# Patient Record
Sex: Male | Born: 1970 | Race: White | Hispanic: No | Marital: Married | Smoking: Never smoker
Health system: Southern US, Community
[De-identification: ages and names within clinical notes are randomized; demographics above are authoritative.]

## PROBLEM LIST (undated history)

## (undated) DIAGNOSIS — E119 Type 2 diabetes mellitus without complications: Secondary | ICD-10-CM

## (undated) DIAGNOSIS — R0789 Other chest pain: Secondary | ICD-10-CM

## (undated) HISTORY — DX: Type 2 diabetes mellitus without complications: E11.9

## (undated) HISTORY — DX: Other chest pain: R07.89

---

## 2010-11-15 ENCOUNTER — Encounter: Payer: Self-pay | Admitting: Internal Medicine

## 2010-11-21 ENCOUNTER — Ambulatory Visit
Admission: RE | Admit: 2010-11-21 | Discharge: 2010-11-21 | Payer: Self-pay | Source: Home / Self Care | Attending: Internal Medicine | Admitting: Internal Medicine

## 2010-11-21 ENCOUNTER — Ambulatory Visit: Payer: Self-pay | Admitting: Internal Medicine

## 2010-11-21 DIAGNOSIS — R0989 Other specified symptoms and signs involving the circulatory and respiratory systems: Secondary | ICD-10-CM

## 2010-11-21 DIAGNOSIS — R079 Chest pain, unspecified: Secondary | ICD-10-CM | POA: Insufficient documentation

## 2010-11-21 DIAGNOSIS — R0609 Other forms of dyspnea: Secondary | ICD-10-CM | POA: Insufficient documentation

## 2010-11-25 ENCOUNTER — Telehealth: Payer: Self-pay | Admitting: Internal Medicine

## 2010-11-27 ENCOUNTER — Ambulatory Visit: Admit: 2010-11-27 | Payer: Self-pay | Admitting: Internal Medicine

## 2010-11-27 ENCOUNTER — Inpatient Hospital Stay (HOSPITAL_COMMUNITY)
Admission: EM | Admit: 2010-11-27 | Discharge: 2010-12-02 | Disposition: A | Discharge status: Home / Self Care | Payer: Self-pay | Source: Home / Self Care | Attending: Internal Medicine | Admitting: Internal Medicine

## 2010-11-27 LAB — CBC
HCT: 44.4 % (ref 39.0–52.0)
Hemoglobin: 15.4 g/dL (ref 13.0–17.0)
MCH: 29.8 pg (ref 26.0–34.0)
MCHC: 34.7 g/dL (ref 30.0–36.0)
MCV: 86 fL (ref 78.0–100.0)
Platelets: 279 10*3/uL (ref 150–400)
RBC: 5.16 MIL/uL (ref 4.22–5.81)
RDW: 12 % (ref 11.5–15.5)
WBC: 15 10*3/uL — ABNORMAL HIGH (ref 4.0–10.5)

## 2010-11-27 LAB — BASIC METABOLIC PANEL
BUN: 13 mg/dL (ref 6–23)
CO2: 24 mEq/L (ref 19–32)
Calcium: 9.1 mg/dL (ref 8.4–10.5)
Chloride: 102 mEq/L (ref 96–112)
Creatinine, Ser: 0.95 mg/dL (ref 0.4–1.5)
GFR calc Af Amer: >60 mL/min (ref >60)
GFR calc non Af Amer: >60 mL/min (ref >60)
Glucose, Bld: 161 mg/dL — ABNORMAL HIGH (ref 70–99)
Potassium: 3.9 mEq/L (ref 3.5–5.1)
Sodium: 136 mEq/L (ref 135–145)

## 2010-11-27 LAB — D-DIMER, QUANTITATIVE (NOT AT ARMC): D-Dimer, Quant: 2.13 ug/mL-FEU — ABNORMAL HIGH (ref 0.00–0.48)

## 2010-11-28 LAB — CBC
HCT: 40.6 % (ref 39.0–52.0)
Hemoglobin: 14 g/dL (ref 13.0–17.0)
MCH: 29.9 pg (ref 26.0–34.0)
MCHC: 34.5 g/dL (ref 30.0–36.0)
MCV: 86.6 fL (ref 78.0–100.0)
Platelets: 242 10*3/uL (ref 150–400)
RBC: 4.69 MIL/uL (ref 4.22–5.81)
RDW: 12.2 % (ref 11.5–15.5)
WBC: 16.2 10*3/uL — ABNORMAL HIGH (ref 4.0–10.5)

## 2010-11-28 LAB — BASIC METABOLIC PANEL
BUN: 9 mg/dL (ref 6–23)
CO2: 26 mEq/L (ref 19–32)
Calcium: 8.8 mg/dL (ref 8.4–10.5)
Chloride: 100 mEq/L (ref 96–112)
Creatinine, Ser: 1.04 mg/dL (ref 0.4–1.5)
GFR calc Af Amer: >60 mL/min (ref >60)
GFR calc non Af Amer: >60 mL/min (ref >60)
Glucose, Bld: 151 mg/dL — ABNORMAL HIGH (ref 70–99)
Potassium: 4.6 mEq/L (ref 3.5–5.1)
Sodium: 134 mEq/L — ABNORMAL LOW (ref 135–145)

## 2010-11-28 LAB — HEPATIC FUNCTION PANEL
ALT: 42 U/L (ref 0–53)
AST: 31 U/L (ref 0–37)
Albumin: 3.2 g/dL — ABNORMAL LOW (ref 3.5–5.2)
Alkaline Phosphatase: 80 U/L (ref 39–117)
Bilirubin, Direct: 0.3 mg/dL (ref 0.0–0.3)
Indirect Bilirubin: 1.2 mg/dL — ABNORMAL HIGH (ref 0.3–0.9)
Total Bilirubin: 1.5 mg/dL — ABNORMAL HIGH (ref 0.3–1.2)
Total Protein: 7.5 g/dL (ref 6.0–8.3)

## 2010-11-28 LAB — STREP PNEUMONIAE URINARY ANTIGEN: Strep Pneumo Urinary Antigen: NEGATIVE

## 2010-11-28 LAB — PROCALCITONIN: Procalcitonin: 0.22 ng/mL

## 2010-11-28 LAB — TSH: TSH: 1.134 u[IU]/mL (ref 0.350–4.500)

## 2010-11-29 LAB — INFLUENZA PANEL BY PCR (TYPE A & B)
H1N1 flu by pcr: NOT DETECTED
Influenza A By PCR: NEGATIVE
Influenza B By PCR: NEGATIVE

## 2010-11-29 LAB — CBC
HCT: 39.6 % (ref 39.0–52.0)
Hemoglobin: 13.6 g/dL (ref 13.0–17.0)
MCH: 30 pg (ref 26.0–34.0)
MCHC: 34.3 g/dL (ref 30.0–36.0)
MCV: 87.2 fL (ref 78.0–100.0)
Platelets: 235 10*3/uL (ref 150–400)
RBC: 4.54 MIL/uL (ref 4.22–5.81)
RDW: 11.9 % (ref 11.5–15.5)
WBC: 14.6 10*3/uL — ABNORMAL HIGH (ref 4.0–10.5)

## 2010-11-29 LAB — COMPREHENSIVE METABOLIC PANEL
ALT: 52 U/L (ref 0–53)
AST: 34 U/L (ref 0–37)
Albumin: 3 g/dL — ABNORMAL LOW (ref 3.5–5.2)
Alkaline Phosphatase: 81 U/L (ref 39–117)
BUN: 7 mg/dL (ref 6–23)
CO2: 26 mEq/L (ref 19–32)
Calcium: 8.6 mg/dL (ref 8.4–10.5)
Chloride: 102 mEq/L (ref 96–112)
Creatinine, Ser: 0.89 mg/dL (ref 0.4–1.5)
GFR calc Af Amer: >60 mL/min (ref >60)
GFR calc non Af Amer: >60 mL/min (ref >60)
Glucose, Bld: 133 mg/dL — ABNORMAL HIGH (ref 70–99)
Potassium: 4.3 mEq/L (ref 3.5–5.1)
Sodium: 137 mEq/L (ref 135–145)
Total Bilirubin: 1.1 mg/dL (ref 0.3–1.2)
Total Protein: 7.5 g/dL (ref 6.0–8.3)

## 2010-11-29 LAB — HIV ANTIBODY (ROUTINE TESTING W REFLEX): HIV: NONREACTIVE

## 2010-11-29 LAB — DIFFERENTIAL
Basophils Absolute: 0 10*3/uL (ref 0.0–0.1)
Basophils Relative: 0 % (ref 0–1)
Eosinophils Absolute: 0.1 10*3/uL (ref 0.0–0.7)
Eosinophils Relative: 0 % (ref 0–5)
Lymphocytes Relative: 13 % (ref 12–46)
Lymphs Abs: 1.9 10*3/uL (ref 0.7–4.0)
Monocytes Absolute: 1.5 10*3/uL — ABNORMAL HIGH (ref 0.1–1.0)
Monocytes Relative: 10 % (ref 3–12)
Neutro Abs: 11.1 10*3/uL — ABNORMAL HIGH (ref 1.7–7.7)
Neutrophils Relative %: 76 % (ref 43–77)

## 2010-11-29 LAB — GLUCOSE, CAPILLARY
Glucose-Capillary: 172 mg/dL — ABNORMAL HIGH (ref 70–99)
Glucose-Capillary: 117 mg/dL — ABNORMAL HIGH (ref 70–99)

## 2010-11-29 LAB — HEMOGLOBIN A1C
Hgb A1c MFr Bld: 6.6 % — ABNORMAL HIGH (ref <5.7)
Mean Plasma Glucose: 143 mg/dL — ABNORMAL HIGH (ref <117)

## 2010-11-30 LAB — GLUCOSE, CAPILLARY
Glucose-Capillary: 128 mg/dL — ABNORMAL HIGH (ref 70–99)
Glucose-Capillary: 112 mg/dL — ABNORMAL HIGH (ref 70–99)
Glucose-Capillary: 99 mg/dL (ref 70–99)
Glucose-Capillary: 132 mg/dL — ABNORMAL HIGH (ref 70–99)
Glucose-Capillary: 128 mg/dL — ABNORMAL HIGH (ref 70–99)

## 2010-11-30 LAB — DIFFERENTIAL
Basophils Absolute: 0 10*3/uL (ref 0.0–0.1)
Basophils Relative: 0 % (ref 0–1)
Eosinophils Absolute: 0.1 10*3/uL (ref 0.0–0.7)
Eosinophils Relative: 1 % (ref 0–5)
Lymphocytes Relative: 12 % (ref 12–46)
Lymphs Abs: 1.4 10*3/uL (ref 0.7–4.0)
Monocytes Absolute: 0.9 10*3/uL (ref 0.1–1.0)
Monocytes Relative: 8 % (ref 3–12)
Neutro Abs: 9.5 10*3/uL — ABNORMAL HIGH (ref 1.7–7.7)
Neutrophils Relative %: 80 % — ABNORMAL HIGH (ref 43–77)

## 2010-11-30 LAB — LEGIONELLA ANTIGEN, URINE: Legionella Antigen, Urine: NEGATIVE

## 2010-11-30 LAB — CBC
HCT: 38.2 % — ABNORMAL LOW (ref 39.0–52.0)
Hemoglobin: 13 g/dL (ref 13.0–17.0)
MCH: 29.2 pg (ref 26.0–34.0)
MCHC: 34 g/dL (ref 30.0–36.0)
MCV: 85.8 fL (ref 78.0–100.0)
Platelets: 273 10*3/uL (ref 150–400)
RBC: 4.45 MIL/uL (ref 4.22–5.81)
RDW: 12.1 % (ref 11.5–15.5)
WBC: 11.9 10*3/uL — ABNORMAL HIGH (ref 4.0–10.5)

## 2010-12-01 LAB — DIFFERENTIAL
Basophils Absolute: 0 10*3/uL (ref 0.0–0.1)
Basophils Relative: 0 % (ref 0–1)
Eosinophils Absolute: 0.1 10*3/uL (ref 0.0–0.7)
Eosinophils Relative: 1 % (ref 0–5)
Lymphocytes Relative: 10 % — ABNORMAL LOW (ref 12–46)
Lymphs Abs: 1.2 10*3/uL (ref 0.7–4.0)
Monocytes Absolute: 1 10*3/uL (ref 0.1–1.0)
Monocytes Relative: 8 % (ref 3–12)
Neutro Abs: 10.1 10*3/uL — ABNORMAL HIGH (ref 1.7–7.7)
Neutrophils Relative %: 81 % — ABNORMAL HIGH (ref 43–77)

## 2010-12-01 LAB — BASIC METABOLIC PANEL
BUN: 7 mg/dL (ref 6–23)
CO2: 28 mEq/L (ref 19–32)
Calcium: 9 mg/dL (ref 8.4–10.5)
Chloride: 102 mEq/L (ref 96–112)
Creatinine, Ser: 0.88 mg/dL (ref 0.4–1.5)
GFR calc Af Amer: >60 mL/min (ref >60)
GFR calc non Af Amer: >60 mL/min (ref >60)
Glucose, Bld: 144 mg/dL — ABNORMAL HIGH (ref 70–99)
Potassium: 4.2 mEq/L (ref 3.5–5.1)
Sodium: 138 mEq/L (ref 135–145)

## 2010-12-01 LAB — CBC
HCT: 38.4 % — ABNORMAL LOW (ref 39.0–52.0)
Hemoglobin: 13 g/dL (ref 13.0–17.0)
MCH: 29.2 pg (ref 26.0–34.0)
MCHC: 33.9 g/dL (ref 30.0–36.0)
MCV: 86.3 fL (ref 78.0–100.0)
Platelets: 303 10*3/uL (ref 150–400)
RBC: 4.45 MIL/uL (ref 4.22–5.81)
RDW: 12.1 % (ref 11.5–15.5)
WBC: 12.5 10*3/uL — ABNORMAL HIGH (ref 4.0–10.5)

## 2010-12-01 LAB — CULTURE, RESPIRATORY W GRAM STAIN

## 2010-12-01 LAB — GLUCOSE, CAPILLARY
Glucose-Capillary: 117 mg/dL — ABNORMAL HIGH (ref 70–99)
Glucose-Capillary: 181 mg/dL — ABNORMAL HIGH (ref 70–99)
Glucose-Capillary: 115 mg/dL — ABNORMAL HIGH (ref 70–99)
Glucose-Capillary: 134 mg/dL — ABNORMAL HIGH (ref 70–99)

## 2010-12-01 LAB — CULTURE, RESPIRATORY

## 2010-12-02 LAB — GLUCOSE, CAPILLARY
Glucose-Capillary: 121 mg/dL — ABNORMAL HIGH (ref 70–99)
Glucose-Capillary: 104 mg/dL — ABNORMAL HIGH (ref 70–99)
Glucose-Capillary: 110 mg/dL — ABNORMAL HIGH (ref 70–99)

## 2010-12-04 LAB — CULTURE, BLOOD (ROUTINE X 2)
Culture  Setup Time: 201201270548
Culture  Setup Time: 201201270548
Culture: NO GROWTH
Culture: NO GROWTH

## 2010-12-04 NOTE — Assessment & Plan Note (Signed)
Summary: Pulmonary consultation/ unexplained doe and R CP   Visit Type:  Initial Consult Copy to:  [facility] Primary Provider/Referring Provider:  none  CC:  CP.  History of Present Illness: 40 yom  hispanic / PR with tendency to sinus problems but never breathing problems previously and no problem with competition sports through HS and even in 20's.  When started back up with ex mid 30s didn't feel normal aerobic capacity.   November 21, 2010  1st pulmonary office eval cc sob assoc with R CP >  rec otc tylenol better but back nl nl no cough, typically while standing never while lying down,  equal front / back lasts several min to up to 45 min, always better lying down, somewhat migratory and improved with moving shoulder into different positions also.   no assoc cough. onset was 7 days ago, abupt, not progressive, variable.    Pt denies any significant sore throat, dysphagia, itching, sneezing,  nasal congestion or excess secretions,  fever, chills, sweats, unintended wt loss, pleuritic or exertional cp, hempoptysis, change in activity tolerance  orthopnea pnd or leg swelling.  no unusual travel. no nausea or change with meals/ greasy foods.  Current Medications (verified): 1)  Tylenol Extra Strength 500 Mg Tabs (Acetaminophen) .... 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  The patient denies shortness of breath with activity, shortness of breath at rest, productive cough, non-productive cough, coughing up blood, irregular heartbeats, acid heartburn, indigestion, loss of appetite, weight change, abdominal pain, difficulty swallowing, sore throat,  tooth/dental problems, headaches, nasal congestion/difficulty breathing through nose, sneezing, itching, ear ache, anxiety, depression, hand/feet swelling, rash, change in color of mucus, and fever.    Vital Signs:  Patient profile:   40 year old male Height:      69 inches Weight:      208.25 pounds BMI:     30.86 O2 Sat:      96 % on Room air Temp:     98.1 degrees F oral Pulse rate:   86 / minute BP sitting:   120 / 86  (left arm) Cuff size:   large  Vitals Entered By: Vernie Murders (November 21, 2010 11:52 AM)  O2 Flow:  Room air  Serial Vital Signs/Assessments:  Comments: Ambulatory Pulse Oximetry  Resting; HR__80___    02 Sat_96%RA____  Lap1 (185 feet)   HR__98___   02 Sat_94%RA____ Lap2 (185 feet)   HR__97___   02 Sat_94%RA____    Lap3 (185 feet)   HR__94___   02 Sat_96%RA___  _x__Test Completed without Difficulty Zackery Barefoot CMA  November 21, 2010 12:27 PM  ___Test Stopped due to:   By: Zackery Barefoot CMA    Physical Exam  Additional Exam:  anxious amb hispanic male nad HEENT: nl dentition, turbinates, and orophanx. Nl external ear canals without cough reflex NECK :  without JVD/Nodes/TM/ nl carotid upstrokes bilaterally LUNGS: no acc muscle use, clear to A and P bilaterally without cough on insp or exp maneuvers CV:  RRR  no s3 or murmur or increase in P2, no edema  ABD:  soft and nontender with nl excursion in the supine position. No bruits or organomegaly, bowel sounds nl MS:  warm without deformities, calf tenderness, cyanosis or clubbing SKIN: warm and dry without lesions   NEURO:  alert, approp, no deficits Minmal tenderness ruq and over chest wall laterally. neg murphy's sign.    Impression & Recommendations:  Problem # 1:  CHEST PAIN (ICD-786.50) [facility] labs all nl.  Multiple atypical features suggest mscp vs ibs vs  chole  - the fact that is is always relieved in the supine position strongly  suggests ibs:  daytime, not exacerbated  by ex or coughing, worse in sitting or standing  position  not present supine due to the dome effect of the diaphram canceled in that position.    Needs CT chest to complete the w/u but declined to do this today.  Problem # 2:  DYSPNEA (ICD-786.09) nl walking sats with relatively low heart rate strongly argues against a significant cardiac or pulmonary problem so no w/u other than ct anticipated.  could do cpst if persists/ limiting in any way.  Medications Added to Medication List This Visit: 1)  Tylenol Extra Strength 500 Mg Tabs (Acetaminophen) .... Per bottle directions as needed 2)  Tramadol Hcl 50 Mg Tabs (Tramadol hcl) .... One to two by mouth every 4-6 hours as needed for pain  Other Orders: Misc. Referral (Misc. Ref) T-2 View CXR (71020TC) Consultation Level V (99245) Pulse Oximetry, Ambulatory (16109)  Patient Instructions: 1)  Citrucel 1 tsp twice daily with glass of water 2)  Tramadol 50 mg one every 4 hours as needed 3)  Stop any foods you know cause gas especially bean, uncooked vegetables or salads, tums 4)  See Patient Care Coordinator before leaving for CT Scan of chest - if normal and you establish with a primary care doctor next week as you plan then you should  return here if your primary care doctor feels you need to  Prescriptions: TRAMADOL HCL 50 MG  TABS (TRAMADOL HCL) One to two by mouth every 4-6 hours as needed for pain  #40 x 0   Entered and Authorized by:   Nyoka Cowden MD   Signed by:   Nyoka Cowden MD on 11/21/2010   Method used:   Electronically to        [facility] [phone]* (retail)       [address]       [place], [place]  [address]       Ph: [phone]       Fax:    RxID:   [id number]

## 2010-12-04 NOTE — Letter (Signed)
Summary: [facility]    Imported By: Lennie Odor 11/28/2010 09:13:07  _____________________________________________________________________  External Attachment:    Type:   Image     Comment:   External Document

## 2010-12-06 NOTE — Consult Note (Signed)
NAMEDAUNTE, OESTREICH NO.:  [id number]  MEDICAL RECORD NO.:  [id number]          PATIENT TYPE:  INP  LOCATION:  1511                         FACILITY:  [facility]  PHYSICIAN:  Acey Lav, MD  DATE OF BIRTH:  21-Feb-1971  DATE OF CONSULTATION:  11/28/2010 DATE OF DISCHARGE:                                CONSULTATION   REASON FOR INFECTIOUS DISEASE CONSULTATION:  Patient with community- acquired pneumonia, still febrile after approximately 8 days of antibiotic therapy.  HISTORY OF PRESENT ILLNESS:  Francisco Smith is a 40 year old American of [place] descent, who was born in [place].  He has been relatively healthy until early this January when he developed rather right-sided chest pain and shoulder pain.  He was seen in [facility] on January the 12th, where they performed a chest x-ray which showed no acute pulmonary processes.  They also did labs which per Dr. Thurston Hole notes were normal.  He then was seen by Dr. Sherene Sires on the 20th. He was having worsening dyspnea both with exertion and at rest.  Dr. Sherene Sires had wanted to get a CT scan but the patient had to go to work and refused it.  He continued to have dyspnea both on exertion and at rest, along with nonproductive dry cough, malaise, and continued chest pain. Chest pain became more severe yesterday and was quite sharp and he came to the Emergency Department for further evaluation.  In the Emergency Department, chest x-ray done on 26 showed right greater than left bibasilar opacities with a small pleural effusion on the right to the tracking minor fissure on portable chest x-ray.  Two-view film was done which did show bilateral pleural effusions and opacity in the right base.  He had only a CT angiogram within hours.  This showed no pulmonary embolism, but it showed bilateral right lower lobe consolidation along with consolidation in the right middle lobe with small pleural effusion with a  small loculated component adjacent to consolidation in the right middle lobe.  There are also some borderline enlarged lymph nodes as well thought to be due to acute infectious process.  The patient has been on Rocephin and azithromycin overnight. He is persistently febrile.  His white count was around 16,000 and remains roughly 16,000 after 12 hours of antimicrobial therapy.  He endorses 2 sick contacts at work, one of which was diagnosed with the flu and another one diagnosed with a former bronchitis, both of which have worked this time from work.  He knows of no known exposure to anyone with tuberculosis.  He is married.  Denies higher sexual behavior.  Denies intravenous drug use.  PAST MEDICAL HISTORY:  History of hemorrhoids which were discovered on workup for rectal bleeding in 2010, otherwise no significant medical history.  PAST SURGICAL HISTORY:  None.  FAMILY HISTORY:  Father died due to complications related to diabetes mellitus.  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  Denies smoking.  Rarely use alcohol.  Denies recreational drug use.  REVIEW OF SYSTEMS:  As described in history of present illness.  Also, pertinent for some nausea and emesis yesterday.  PHYSICAL EXAMINATION:  VITAL SIGNS:  Temperature maximum is 102.3, temperature current 102, blood pressure 135/77, pulse 104, breathing 20 times a minute, pulse ox 94% room air.  Weight is 91.68 kg. GENERAL:  Pleasant gentleman, slightly diaphoretic. HEENT:  Normocephalic, atraumatic.  Pupils are equal and reactive to light.  Sclerae icteric.  Oropharynx clear. NECK:  Supple. CARDIOVASCULAR:  Regular rate and rhythm.  No murmurs, gallops, or rubs. LUNGS:  With some expiratory wheezes and diminished breath sounds at bases. ABDOMEN:  Soft, nondistended, nontender.  Positive bowel  sounds. EXTREMITIES:  No edema.  LABORATORY DATA:  CT scan ascribed above.  Hepatic function today, bilirubin 1.52, direct 0.3, indirect 1.2, alk phos 80, AST and ALT 31 and 42, albumin 3.2.  Metabolic panel revealed sodium 134, potassium 4.6, chloride 100, bicarb 26, BUN and creatinine 9 and 1.04, glucose 151.  CBC with differential today, white count 16.2, hemoglobin 14, platelets 242,000.  This is in contrast to white count of 15,000 done yesterday. D-dimer was positive at 2.13.  IMPRESSION AND RECOMMENDATIONS:  This is a 40 year old Hispanic American male with acute onset earlier this month of chest pain, now found to have bilateral lower lobe and right middle lobe infiltrates, consistent with community-acquired pneumonia. 1. Community-acquired pneumonia:  The patient is on appropriate     antibiotics with Rocephin and azithromycin.  I will order the     pneumonia protocol which will include insurance that he has had a     flu shot, which I believe he had at that [facility].  We will also     check a pneumococcal antigen and Legionella antigen.  The other     things we need to consider would be whether or not he might have     influenza or still have influenza.  Certainly, I agree with getting     a flu PCR which was done today apparently.  We would need to also     keep in mind the possibility of post influenza methicillin-     sensitive staph aureus or methicillin-resistant staph aureus     pneumonia.  Aspiration pneumonia could be considered, although he     is adamant and denying any episodes of having passed out.  For now,     I would hold him on Rocephin and azithromycin and monitor him.  I     will also check a serum procalcitonin.  I would like to see if     Radiology can get some of the pleural fluid for analysis because it     truly is loculated and he may need more aggressive care including a     chest pigtail catheter or chest tube and potentially in worst-case      very, may need some help from Cardiothoracic Surgery.  For now,     leave him on azithromycin and Rocephin.  I will check a serum     procalcitonin.  We will check him for HIV. 2. Nausea, emesis due to his pneumonia. 3. Slightly elevated bilirubin, not sure what significant of this is     at this point in time.  Thank you for Infectious Disease consultation.     Acey Lav, MD     CV/MEDQ  D:  11/28/2010  T:  11/28/2010  Job:  [id number]  cc:  Francisco Scott, MD  Charlaine Dalton. Sherene Sires, MD, FCCP [address] [place] [address]  Electronically Signed by Paulette Blanch DAM MD on 12/06/2010 10:36:49 AM

## 2010-12-10 NOTE — Progress Notes (Signed)
Summary: Pt no show for CT > admitted to [facility]  Phone Note Outgoing Call   Call placed by: Vernie Murders,  November 25, 2010 2:38 PM Call placed to: Patient Summary of Call: Pt was sched to have ct chest on 11/24/10- did not show for this appt.  Need to find out why and get him to resched if possible.  LMOMTCB. Initial call taken by: Vernie Murders,  November 25, 2010 2:39 PM  Follow-up for Phone Call        [facility] Vernie Murders  December 01, 2010 12:35 PM   Additional Follow-up for Phone Call Additional follow up Details #1::        Dr Sherene Sires, just wanted to let you know tha tpt returned my call, spoke with Nicholos Johns and informed her that he has been admitted to [facility] with PNA and is currently still there now.  Just an FYI. Additional Follow-up by: Vernie Murders,  December 02, 2010 11:13 AM    Additional Follow-up for Phone Call Additional follow up Details #2::    aware Follow-up by: Nyoka Cowden MD,  December 02, 2010 1:07 PM

## 2010-12-14 NOTE — Discharge Summary (Signed)
NAMEBENTON, TOOKER NO.:  [id number]  MEDICAL RECORD NO.:  [id number]          PATIENT TYPE:  INP  LOCATION:  1511                         FACILITY:  [facility]  PHYSICIAN:  Marcellus Scott, MD     DATE OF BIRTH:  [birth year]  DATE OF ADMISSION:  11/27/2010 DATE OF DISCHARGE:  12/02/2010                              DISCHARGE SUMMARY   PRIMARY CARE PHYSICIAN:  Sanda Linger, MD.  PULMONOLOGIST:  Charlaine Dalton. Sherene Sires, MD, FCCP  DISCHARGE DIAGNOSES: 1. Severe community-acquired pneumonia, with pleural effusion and     question loculated empyema near right middle lobe. 2. Newly diagnosed type 2 diabetes mellitus. 3. Leukocytosis. 4. Hypoxemia secondary to pneumonia, resolved. 5. Sepsis on admission secondary to pneumonia.  Sepsis resolved.  DISCHARGE MEDICATIONS: 1. Tylenol 650 mg p.o. q. 4 h. hourly p.r.n. for pain or fever. 2. Avelox 400 mg p.o. daily for an additional 21 days. 3. Icy Hot, one application topically p.r.n. for pain. 4. Tramadol 50 mg p.o. every 6 hourly p.r.n. for pain.  IMAGING: 1.  Chest x-ray November 30, 2010 persistent opacity in the right     perihilar region most consistent with pneumonia and possible     loculated fluid.  Small bilateral effusions remained. 2. Right decubitus chest x-ray on November 28, 2010.  Impression:     Small free flowing right pleural effusion. 3. CT angiogram of the chest with contrast on January 26. Impression:     A.  No pulmonary embolism seen on today's exam.  B.  Right     bilateral lower lobe consolidation, right greater than the left and     consolidation of the right middle lobe compatible with infection.     Small pleural effusion is noted with a small loculated component     adjacent to the consolidation in the right middle lobe.  Borderline     adenopathies detailed above likely reactive from infectious     process. 4. Chest x-ray on January 26.  Impression:  A.  Bilateral pleural     effusions as before.   B.  Persistent opacity at the right base     which may represent atelectasis or airspace consolidation. 5. Chest x-ray November 27, 2010.  Impression:  Very low lung volumes.     Right greater than left basilar opacities favored to be atelectasis     but superimposed infection is difficult to exclude.  Question small     pleural effusion on the right tracking in the minor fissure.  LAB DATA: 1. Sputum culture showed few group A Streptococcus or Streptococcus     pyogenes. 2. Basic metabolic panel on January 30th only remarkable for glucose     of 144, otherwise within normal limits. 3. CBC on the 30th of January; hemoglobin 13, hematocrit 38.4, white     blood cell 12.5, platelets 303,000.  Urine Legionella antigen was     negative.  Influenza panel by PCR both A, B, and H1N1 were     negative. 4. Blood cultures x2 are no growth to date. 5. Hepatic panel on the January 28 only remarkable for  albumin of 3.     HIV antibodies was nonreactive.  Hemoglobin A1c of 6.6.  Urinary     streptococcal pneumonia antigen was negative.  Procalcitonin was     0.22, TSH 1.134.  Admitting D-dimer was 2.13 and white blood cell     was 15,000.  CONSULTATION:  Infectious disease, Dr. Paulette Blanch Dam.  DIET:  Diabetic diet.  ACTIVITY:  Ad lib.  His complaints today, none.  The patient is eager to go home.  He had mild pink eye on the right side yesterday which has improved.  He denies chest pain or dyspnea.  PHYSICAL EXAMINATION:  GENERAL:  The patient is ambulating in halls with no obvious distress. VITAL SIGNS:  Temperature maximum was 99.9 degrees Fahrenheit with temperature current of 99.3, pulse 85 per minute, respirations 18 per minute, blood pressure 116/75 mmHg, oxygen saturation 96% on room air and CBG is ranging between 110 to 134 mg/dL requiring very little insulin. RESPIRATORY SYSTEM:  Much improved breath sounds in both lung fields, especially the right.  He has slightly decreased  breath sounds in the right base but otherwise clear to auscultation.  No increased work of breathing. CARDIOVASCULAR SYSTEM:  First and second heart sounds heard.  Regular rate and rhythm.  No murmurs or JVD.  ABDOMEN:  Soft and bowel sounds present, nondistended, nontender. CENTRAL NERVOUS SYSTEM:  The patient is awake, alert, oriented x3 with no focal neurological deficits. EYE EXAM:  The patient has a faint redness on the temporal aspect of the right conjunctiva which is improving. 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  HOSPITAL COURSE:  Mr. Epperly is a 40 year old gentleman with no significant past medical history, presented with right-sided pleuritic chest pain, dyspnea.  He had this chest pain for approximately 2 weeks. He had been seen by a pulmonologist. Also at that time, chest x-ray was negative.  He was sent home on pain medications.  He however returned with worsening pain.  Chest x-ray now was suggestive of multilobar pneumonia.  This was followed up with a CT which confirmed the same.  He was admitted for further management. 1. Sepsis secondary to community-acquired pneumonia.  Sepsis resolved. 2. Multilobar community-acquired pneumonia.  The patient was admitted     to the hospital.  He was placed on droplet isolation.  He was     born and brought up in the [place] and has no travel history.     He had sickly contacts at his workplace.  There is no history of     exposure to TB.  Cultures were sent off and he was placed     empirically on IV Rocephin and Zithromax.  Despite being on these     for approximately a day, he continued to spike high fevers,     following which infectious disease was consulted and kindly provided by Dr. Paulette Blanch Dam.  He did not make any changes to his antibiotics.  He reccommended a diagnostic thoracentesis to rule out empyema which the patient declined.  The patient  however subsequently started getting  better with     defervescense of his fever, improvement of his leukocytosis.     The infectious disease MD has seen him today and indicates that the     patient has severe community-acquired pneumonia with pleural     effusion and question of loculation.  He indicates that in case     this is empyema of the middle  lobe that is loculated and not free     flowing, it would be prudent to treat him with a longer course of     antibiotics such as Avelox.  He also recommends follow up      chest x-ray versus a CT scan in the next couple of weeks.  I have     discussed his case with his pulmonologist and the patient will be     seen in the pulmonary MD's office.  The patient has also been     advised to seek immediate medical attention if there is any decline     in his condition such as high fevers, worsening chest pains,     difficulty breathing, cough, or anything out of the ordinary.  He     verbalizes understanding. 3. Hypoxemia secondary to pneumonia, resolved. 4. New type 2 diabetes mellitus.  This is by the hemoglobin A1c     criteria.  He was placed on sliding-scale insulin and diabetes     education was done.  He has required very minimal insulin, so he     can be managed on diet and exercise.  He will receive     outpatient diabetes education too. 5. Leukocytosis secondary to sepsis, improved.  DISPOSITION:  This patient is discharged home in stable condition.  FOLLOWUP RECOMMENDATIONS: 1. With Rubye Oaks, NP for Dr. Sandrea Hughs on the February 13 at     10:15 a.m. 2. With Dr.  Sanda Linger on February 14 at 10.30 a.m.  Time dictating and coordinating this discharge is 35 minutes.     Marcellus Scott, MD     AH/MEDQ  D:  12/02/2010  T:  12/02/2010  Job:  [id number]  cc:   Sanda Linger, MD [id number] [place] [address]  Charlaine Dalton. Sherene Sires, MD, FCCP [address] [place] [address]  Electronically Signed by Marcellus Scott MD on 12/14/2010 11:39:07  PM

## 2010-12-15 ENCOUNTER — Ambulatory Visit (INDEPENDENT_AMBULATORY_CARE_PROVIDER_SITE_OTHER)
Admission: RE | Admit: 2010-12-15 | Discharge: 2010-12-15 | Disposition: A | Discharge status: Home / Self Care | Payer: BC Managed Care – PPO | Source: Ambulatory Visit | Attending: Internal Medicine | Admitting: Internal Medicine

## 2010-12-15 ENCOUNTER — Encounter: Payer: Self-pay | Admitting: Adult Health

## 2010-12-15 ENCOUNTER — Inpatient Hospital Stay (INDEPENDENT_AMBULATORY_CARE_PROVIDER_SITE_OTHER): Payer: BC Managed Care – PPO | Admitting: Adult Health

## 2010-12-15 ENCOUNTER — Other Ambulatory Visit: Payer: Self-pay | Admitting: Internal Medicine

## 2010-12-15 DIAGNOSIS — J189 Pneumonia, unspecified organism: Secondary | ICD-10-CM

## 2010-12-16 ENCOUNTER — Ambulatory Visit (INDEPENDENT_AMBULATORY_CARE_PROVIDER_SITE_OTHER): Payer: BC Managed Care – PPO | Admitting: Internal Medicine

## 2010-12-16 ENCOUNTER — Encounter: Payer: Self-pay | Admitting: Internal Medicine

## 2010-12-16 DIAGNOSIS — E119 Type 2 diabetes mellitus without complications: Secondary | ICD-10-CM | POA: Insufficient documentation

## 2010-12-16 LAB — HM DIABETES FOOT EXAM

## 2010-12-19 NOTE — H&P (Signed)
NAME:  Francisco Smith, Francisco Smith NO.:  [id number]  MEDICAL RECORD NO.:  [id number]          PATIENT TYPE:  INP  LOCATION:  0108                         FACILITY:  [facility]  PHYSICIAN:  Peggye Pitt, M.D. DATE OF BIRTH:  [birth year]  DATE OF ADMISSION:  11/27/2010 DATE OF DISCHARGE:                             HISTORY & PHYSICAL   PRIMARY CARE PHYSICIAN:  He is unassigned as he currently does not have one and said he goes to a  [facility].  CHIEF COMPLAINT:  Right sided chest pain and shortness of breath.  HISTORY OF PRESENT ILLNESS:  Francisco Smith is a pleasant 40 year old gentleman with no significant past medical history who presented to the hospital today for evaluation of right-sided chest pain and shortness of breath. He states that the chest pain initially developed approximately 2 weeks prior.  It was over the right side of his chest apparently.  He went to [facility] where they did a chest x-ray and said that he had a "spot on his right lung" and referred him to a pulmonologist.  The patient saw Dr. Sandrea Hughs with [facility] on Friday, which is approximately 1 week ago at which time, another chest x-ray was repeated; however, they are not aware of those results.  Regardless, Francisco Smith returns to the emergency department today with significant worsening of his right-sided chest pain, now up to the point where it is very difficult for him to lean forehead and he is very short of breath. Because of this, emergency room department has contacted [place] for evaluation for admission for further management.  Of note, he denies any fever, but has had chills and has had a nonproductive cough for the 2 weeks.  ALLERGIES:  He has no known drug allergies.  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  MEDICATIONS:  He does not take any.  SOCIAL HISTORY:  He denies  any alcohol, tobacco, or illicit drug use. He is married and his wife is present at the time of my exam.  He works at a [employer].  FAMILY HISTORY:  Significant for father and 2 aunts who have diabetes, but otherwise negative for heart disease, cancer, and strokes.  REVIEW OF SYSTEMS:  Negative except as already mentioned in the history of present illness.  PHYSICAL EXAMINATION:  VITAL SIGNS:  On admission, blood pressure 115/74, heart rate 115, respirations 24, saturations of 89% on room air, and a temperature of 102.3. GENERAL:  He is alert, awake, and oriented x3.  He is very pleasant.  He does appear acutely ill with difficulty breathing and leaning forward. HEENT:  Normocephalic and atraumatic.  Pupils are equally round and reactive to light and accommodation.  He has intact extraocular movements. NECK:  Supple.  No JVD, no lymphadenopathy, no bruits, no goiter. HEART:  Tachycardic, but with regular rhythm.  I cannot auscultate any murmurs, rubs, or gallops. LUNGS:  He has decreased breath sounds to bilateral bases, otherwise he has diffuse rhonchi. ABDOMEN:  Soft, nontender, and nondistended with positive bowel sounds.  EXTREMITIES:  He has trace pitting edema over bilateral extremities, right greater than left. NEUROLOGIC:  Grossly intact and nonfocal.  LABORATORY DATA:  Labs on admission, sodium 136, potassium 3.9, chloride 102, bicarbonate 24, BUN 13, creatinine 0.95, and glucose of 161.  WBCs 15.0, hemoglobin of 15.5, and platelet count of 279.  D-dimer that is elevated at 2.13.  A chest x-ray that shows bilateral pleural effusions with persistent opacity at the right base, question airspace disease versus atelectasis.  ASSESSMENT AND PLAN: 1. Community-acquired pneumonia.  We will start him on IV Rocephin and     azithromycin.  We will obtain blood and sputum cultures. 2. For his hypoxemia and tachycardia and fever.  This could all     certainly be secondary to  his pneumonia.  However, due to the time     line and his presentation, I do believe he has a moderate pretest     probability for pulmonary embolism with the fact that a D-dimer     that was ordered by the emergency room has also come back     significantly positive.  I believe it is imperative.  I will also     do a CT angiogram of his chest to rule out a pulmonary embolus. 3. Leukocytosis. 4. Fever. 5. Hypoxemia. 6. Prophylaxis, he will be on Lovenox for deep venous thrombosis     prophylaxis.     Peggye Pitt, M.D.     EH/MEDQ  D:  11/27/2010  T:  11/27/2010  Job:  [id number]  Electronically Signed by Peggye Pitt M.D. on 12/19/2010 05:06:16 PM

## 2010-12-24 NOTE — Assessment & Plan Note (Signed)
Summary: New pt/Bcbs/#/cd   Vital Signs:  Patient profile:   40 year old male Height:      68.5 inches Weight:      199 pounds BMI:     29.93 O2 Sat:      97 % on Room air Temp:     97.8 degrees F oral Pulse rate:   62 / minute Pulse rhythm:   regular Resp:     16 per minute BP sitting:   110 / 70  (left arm) Cuff size:   large  Vitals Entered By: Rock Nephew CMA (December 16, 2010 10:38 AM)  Nutrition Counseling: Patient's BMI is greater than 25 and therefore counseled on weight management options.  O2 Flow:  Room air CC: new to establish Is Patient Diabetic? Yes Did you bring your meter with you today? No Pain Assessment Patient in pain? no        Primary Care Provider:  Etta Grandchild MD  CC:  new to establish.  History of Present Illness: New to me for DM II that was discovered recently during an admission for PNA. His Hgb A1C= 6.6. He is feeling well today.  Preventive Screening-Counseling & Management  Alcohol-Tobacco     Alcohol drinks/day: 0     Alcohol Counseling: not indicated; patient does not drink     Smoking Status: never     Tobacco Counseling: not indicated; no tobacco use  Hep-HIV-STD-Contraception     Hepatitis Risk: no risk noted     HIV Risk: no risk noted     STD Risk: no risk noted     Dental Visit-last 6 months yes     Dental Care Counseling: to seek dental care; no dental care within six months     TSE monthly: yes     Testicular SE Education/Counseling to perform regular STE      Sexual History:  currently monogamous.        Drug Use:  never.        Blood Transfusions:  no.    Medications Prior to Update: 1)  Tramadol Hcl 50 Mg  Tabs (Tramadol Hcl) .... One To Two By Mouth Every 4-6 Hours As Needed For Pain 2)  Tylenol 8 Hour 650 Mg Cr-Tabs (Acetaminophen) .Marland Kitchen.. 1 Tabs By Mouth Every 4 Hours As Needed For Pain or Fever  Current Medications (verified): 1)  Tramadol Hcl 50 Mg  Tabs (Tramadol Hcl) .... One To Two By Mouth  Every 4-6 Hours As Needed For Pain 2)  Tylenol 8 Hour 650 Mg Cr-Tabs (Acetaminophen) .Marland Kitchen.. 1 Tabs By Mouth Every 4 Hours As Needed For Pain or Fever 3)  Avelox 400 Mg Tabs (Moxifloxacin Hcl) .... 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 Atypical R CP      - CT chest rec November 21, 2010  Severe CAP , sepsis 11/27/10  Diabetes mellitus, type II  Past Surgical History: Denies surgical history  Social History: Hepatitis Risk:  no risk noted HIV Risk:  no risk noted STD Risk:  no risk noted Dental Care w/in 6 mos.:  yes Sexual History:  currently monogamous  Drug Use:  never Blood Transfusions:  no  Review of Systems  The patient denies anorexia, fever, weight loss, weight gain, chest pain, syncope, dyspnea on exertion, peripheral edema, prolonged cough, headaches, hemoptysis, abdominal pain, hematuria, suspicious skin lesions, enlarged lymph nodes, and angioedema.   Endo:  Denies cold intolerance, excessive hunger, excessive thirst, excessive urination, heat intolerance, polyuria, and weight change.  Physical Exam  General:  alert, well-developed, well-nourished, well-hydrated, appropriate dress, normal appearance, cooperative to examination, good hygiene, and overweight-appearing.   Head:  normocephalic, atraumatic, and no abnormalities observed.   Mouth:  Oral mucosa and oropharynx without lesions or exudates.  Teeth in good repair. Neck:  supple, full ROM, no masses, no thyromegaly, no thyroid nodules or tenderness, no JVD, normal carotid upstroke, no carotid bruits, no cervical  lymphadenopathy, and no neck tenderness.   Lungs:  normal respiratory effort, no intercostal retractions, no accessory muscle use, normal breath sounds, no dullness, no fremitus, no crackles, and no wheezes.   Heart:  normal rate, regular rhythm, no murmur, no gallop, no rub, and no JVD.   Abdomen:  soft, non-tender, normal bowel sounds, no distention, no masses, no guarding, no rigidity, no rebound tenderness, no abdominal hernia, no inguinal hernia, no hepatomegaly, and no splenomegaly.   Genitalia:  uncircumcised, no hydrocele, no varicocele, no scrotal masses, no testicular masses or atrophy, no cutaneous lesions, and no urethral discharge.   Msk:  No deformity or scoliosis noted of thoracic or lumbar spine.   Pulses:  R and L carotid,radial,femoral,dorsalis pedis and posterior tibial pulses are full and equal bilaterally Extremities:  No clubbing, cyanosis, edema, or deformity noted with normal full range of motion of all joints.   Neurologic:  No cranial nerve deficits noted. Station and gait are normal. Plantar reflexes are down-going bilaterally. DTRs are symmetrical throughout. Sensory, motor and coordinative functions appear intact. Skin:  he has "snake skin syndrome". no suspicious lesions, no ecchymoses, no petechiae, no purpura, no ulcerations, and no edema.   Cervical Nodes:  no anterior cervical adenopathy and no posterior cervical adenopathy.   Axillary Nodes:  no R axillary adenopathy and no L axillary adenopathy.   Inguinal Nodes:  no R inguinal adenopathy and no L inguinal adenopathy.   Psych:  Cognition and judgment appear intact. Alert and cooperative with normal attention span and concentration. No apparent delusions, illusions, hallucinations  Diabetes Management Exam:    Foot Exam (with socks and/or shoes not present):       Sensory-Pinprick/Light touch:          Left medial foot (L-4): normal          Left dorsal foot (L-5): normal          Left lateral foot (S-1):  normal          Right medial foot (L-4): normal          Right dorsal foot (L-5): normal          Right lateral foot (S-1): normal       Sensory-Monofilament:          Left foot: normal          Right foot: normal       Inspection:          Left foot: normal          Right foot: normal       Nails:          Left foot: normal  Right foot: normal   Impression & Recommendations:  Problem # 1:  DIABETES MELLITUS, TYPE II (ICD-250.00) Assessment New no meds needed at this time Orders: Diabetic Clinic Referral (Diabetic) Ophthalmology Referral (Ophthalmology)  Complete Medication List: 1)  Tramadol Hcl 50 Mg Tabs (Tramadol hcl) .... One to two by mouth every 4-6 hours as needed for pain 2)  Tylenol 8 Hour 650 Mg Cr-tabs (Acetaminophen) .Marland Kitchen.. 1 tabs by mouth every 4 hours as needed for pain or fever 3)  Avelox 400 Mg Tabs (Moxifloxacin hcl) .... Take 1 tablet by mouth once a day  Patient Instructions: 1)  Please schedule a follow-up appointment in 2 months. 2)  It is important that you exercise regularly at least 20 minutes 5 times a week. If you develop chest pain, have severe difficulty breathing, or feel very tired , stop exercising immediately and seek medical attention. 3)  You need to lose weight. Consider a lower calorie diet and regular exercise.  4)  Check your blood sugars regularly. If your readings are usually above 200 or below 70 you should contact our office. 5)  It is important that your Diabetic A1c level is checked every 3 months. 6)  See your eye doctor yearly to check for diabetic eye damage. 7)  Check your feet each night for sore areas, calluses or signs of infection. 8)  Check your Blood Pressure regularly. If it is above 130/80: you should make an appointment.   Orders Added: 1)  Diabetic Clinic Referral [Diabetic] 2)  Ophthalmology Referral [Ophthalmology] 3)  New Patient Level III [id number]

## 2010-12-24 NOTE — Assessment & Plan Note (Signed)
Summary: NP follow up - post hosp   Copy to:  [facility] Primary Provider/Referring Provider:  none  CC:  post hosp follow up - states occ gets sharp pain in his right side w/ sneezing and yawning but tramadol helps with this and does not last long.  some DOE since discharge.  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  When started back up with ex mid 30s didn't feel normal aerobic capacity.   November 21, 2010  1st pulmonary office eval cc sob assoc with R CP >  rec otc tylenol better but back nl nl no cough, typically while standing never while lying down,  equal front / back lasts several min to up to 45 min, always better lying down, somewhat migratory and improved with moving shoulder into different positions also.   no assoc cough. onset was 7 days ago, abupt, not progressive, variable.>>set up for CT chest, xray was neg.   December 15, 2010 --Presents for post hospital visit. Admitted 1/26-1/31/12 for severe CAP with pleural effusion , Sepsis, hypoxemia. Pt was seen for pulmonary consult on 1/20 for chest wall pain, xray neg. pending CT chest not done due to pt , pt continued to worsen and was admitted .CXR on admission showed multlobar PNA with effusion , pt was hypoxia and septic. He was treated with aggressive pulmonary hygiene , IV abx. Seen by ID. CT showed bilateral lower lobe consolidation, r>L, sm. pleural effusion w/ small loculated component. in RML. He responded to therapy and was discharged on Avelox for 3 weeks He was also dx with new onset DM. Has PCP ov tomorrow for follow up. Since discharge he is feeling better.Energy not back yet. No fever or cough. Denies chest pain,  orthopnea, hemoptysis, fever, n/v/d, edema, headache    Preventive Screening-Counseling & Management  Alcohol-Tobacco     Smoking Status: never  Medications  Prior to Update: 1)  Tramadol Hcl 50 Mg  Tabs (Tramadol Hcl) .... One To Two By Mouth Every 4-6 Hours As Needed For Pain  Current Medications (verified): 1)  Tramadol Hcl 50 Mg  Tabs (Tramadol Hcl) .... One To Two By Mouth Every 4-6 Hours As Needed For Pain 2)  Tylenol 8 Hour 650 Mg Cr-Tabs (Acetaminophen) .Marland Kitchen.. 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 Atypical R CP      - CT chest rec November 21, 2010  Severe CAP , sepsis 11/27/10   Social History: Smoking Status:  never  Review of Systems      See HPI  Vital Signs:  Patient profile:   40 year old male Height:      68.5 inches Weight:      199.25 pounds BMI:     29.96 O2 Sat:      97 % on Room air Temp:     98.0 degrees F oral Pulse rate:   67 / minute BP sitting:   122 / 80  (left arm) Cuff size:   large  Vitals Entered  By: Boone Master CNA/MA (December 15, 2010 10:08 AM)  O2 Flow:  Room air CC: post hosp follow up - states occ gets sharp pain in his right side w/ sneezing and yawning but tramadol helps with this and does not last long.  some DOE since discharge Is Patient Diabetic? Yes Comments Medications reviewed with patient Daytime contact number verified with patient. Boone Master CNA/MA  December 15, 2010 10:08 AM    Physical Exam  Additional Exam:  anxious amb hispanic male nad HEENT: nl dentition, turbinates, and orophanx. Nl external ear canals without cough reflex NECK :  without JVD/Nodes/TM/ nl carotid upstrokes bilaterally LUNGS: no acc muscle use, clear to A and P bilaterally without cough on insp or exp maneuvers CV:  RRR   no s3 or murmur or increase in P2, no edema  ABD:  soft and nontender with nl excursion in the supine position. No bruits or organomegaly, bowel sounds nl MS:  warm without deformities, calf tenderness, cyanosis or clubbing SKIN: warm and dry without lesions   NEURO:  alert, approp, no deficits     Impression & Recommendations:  Problem # 1:  PNEUMONIA (ICD-486)  Severe CAP clinically improving.  xray with with improved aeration will repeat on return to document total resolution Plan:  Finsih Avelox  Rest and fluids as needed  follow up Dr. Sherene Sires in 3 weeks  Please contact office for sooner follow up if symptoms do not improve or worsen   Orders: T-2 View CXR (71020TC) Est. Patient Level IV (04540)  Medications Added to Medication List This Visit: 1)  Tylenol 8 Hour 650 Mg Cr-tabs (Acetaminophen) .Marland Kitchen.. 1 tabs by mouth every 4 hours as needed for pain or fever  Patient Instructions: 1)  Finsih Avelox  2)  Rest and fluids as needed  3)  follow up Dr. Sherene Sires in 3 weeks with chest xray  4)  Please contact office for sooner follow up if symptoms do not improve or worsen    Immunization History:  Influenza Immunization History:    Influenza:  historical (11/15/2010)

## 2011-01-08 ENCOUNTER — Ambulatory Visit: Payer: BC Managed Care – PPO | Admitting: Internal Medicine

## 2011-01-15 ENCOUNTER — Ambulatory Visit: Payer: BC Managed Care – PPO | Admitting: Internal Medicine

## 2011-01-16 ENCOUNTER — Telehealth: Payer: Self-pay | Admitting: Internal Medicine

## 2011-01-20 NOTE — Progress Notes (Signed)
Summary: nos appt  Phone Note Call from Patient   Caller: [email or web address]  Call For: Darcel Frane Summary of Call: LMTCB x2 to rsc nos from 3/15. Initial call taken by: Darletta Moll,  January 16, 2011 3:08 PM

## 2011-02-23 ENCOUNTER — Ambulatory Visit: Payer: BC Managed Care – PPO | Admitting: Internal Medicine

## 2011-02-23 DIAGNOSIS — Z0289 Encounter for other administrative examinations: Secondary | ICD-10-CM

## 2012-06-12 IMAGING — CT CT ANGIO CHEST
1 of 2 series · 19 of 32 positions shown · IV contrast (APPLIED)
Comparison: None.

CLINICAL DATA: Chest pain

CT ANGIOGRAPHY CHEST WITH CONTRAST
TECHNIQUE: Multidetector CT imaging of the chest was performed
using the standard protocol during bolus administration of
intravenous contrast.  Multiplanar CT image reconstructions
including MIPs were obtained to evaluate the vascular anatomy.
Contrast:  80 ml of 7mnipaque-GLL

[Series 5: pe thins @ 1mm · axial · 0.60mm/px · z∈[-236,-29]mm · 19 of 227 slices shown]
[im 10/227  lung]
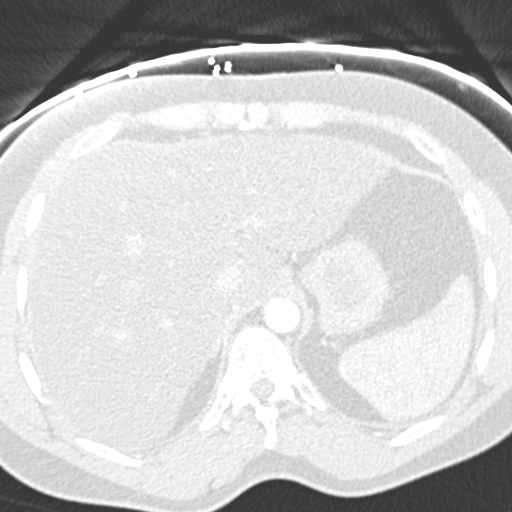
[im 20/227  soft-tissue]
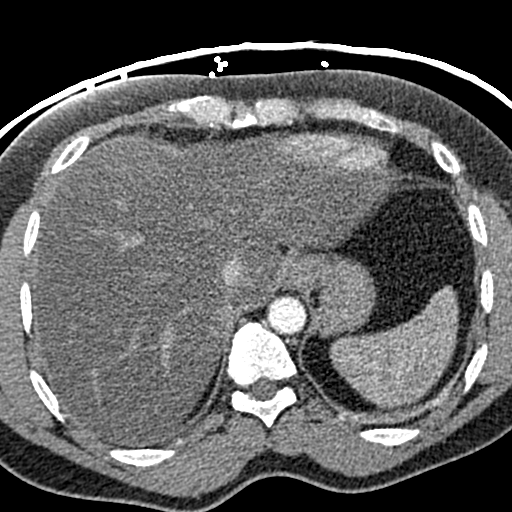
[im 30/227  lung]
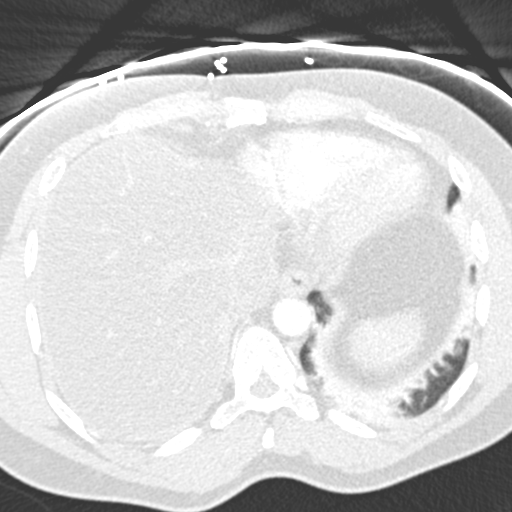
[im 50/227  soft-tissue]
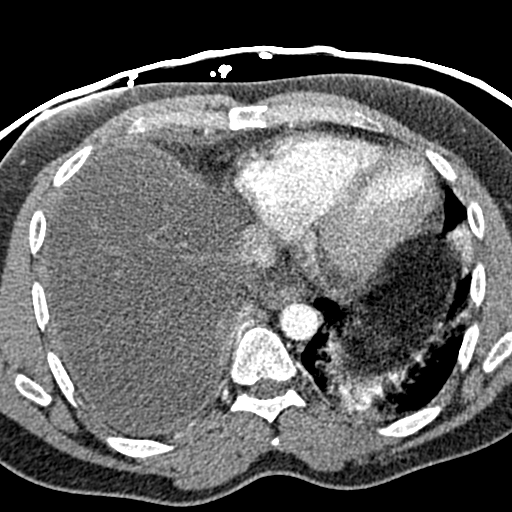
[im 59/227  lung]
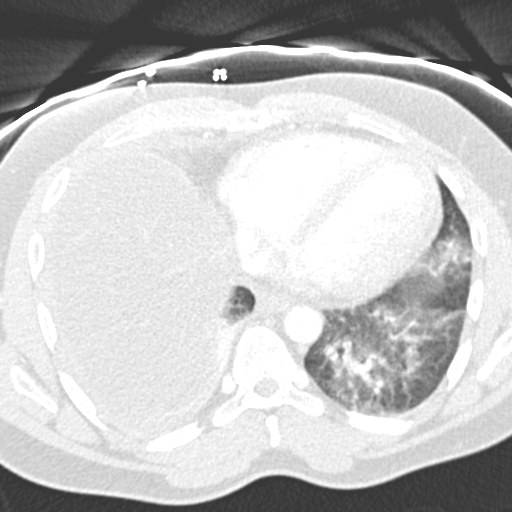
[im 69/227  soft-tissue]
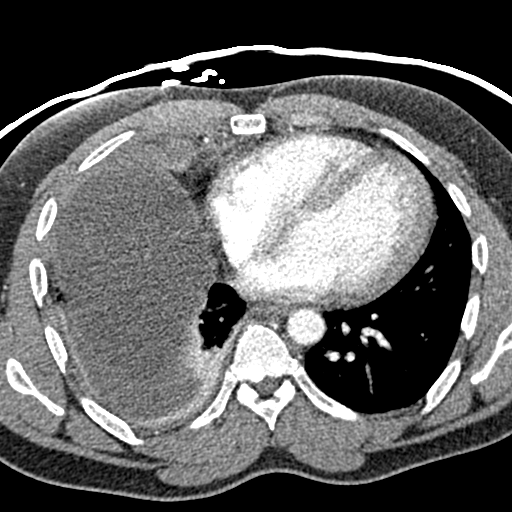
[im 79/227  lung]
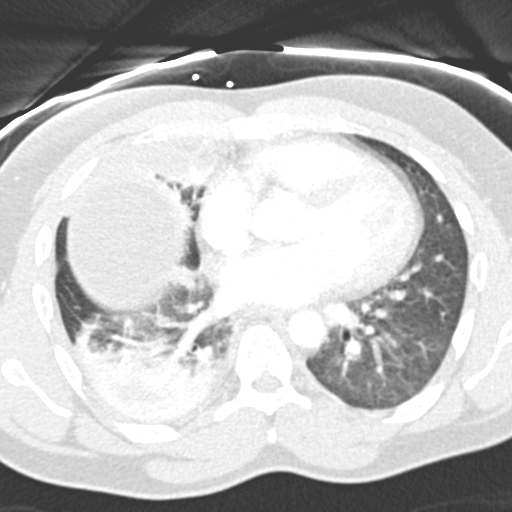
[im 89/227  soft-tissue]
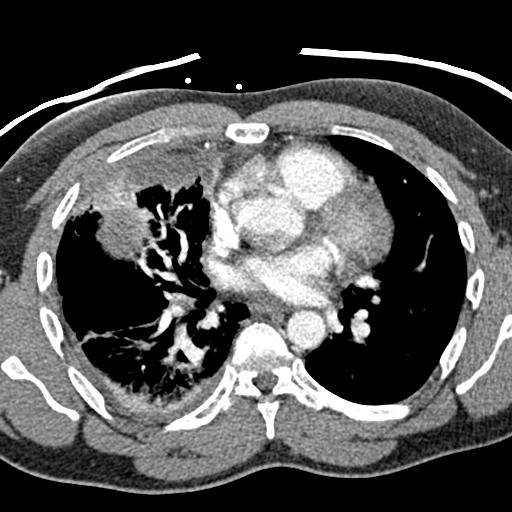
[im 99/227  lung]
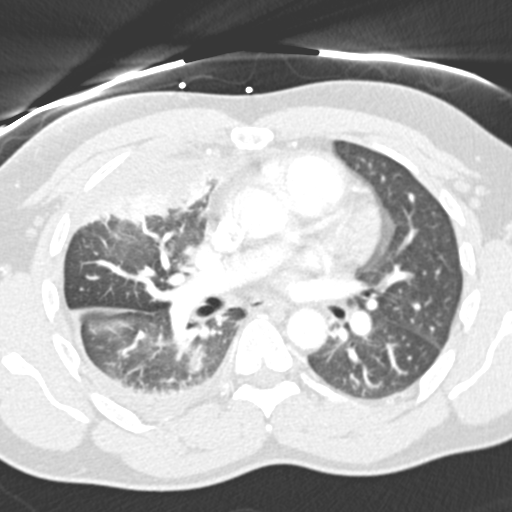
[im 118/227  soft-tissue]
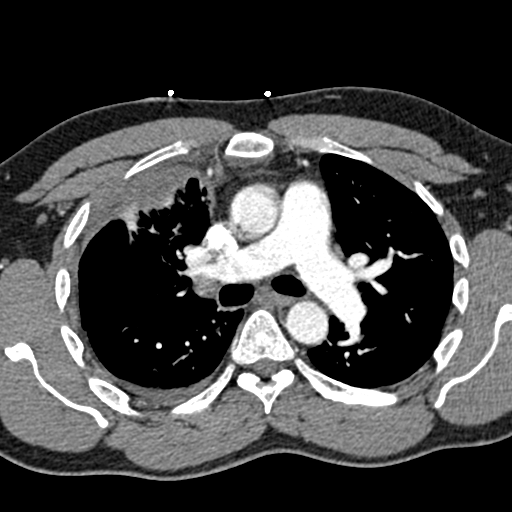
[im 128/227  lung]
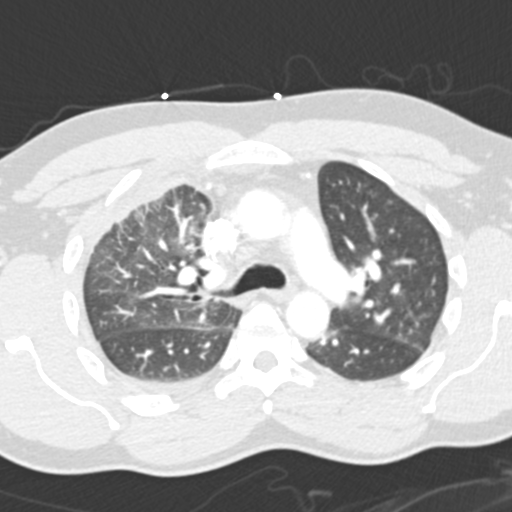
[im 138/227  soft-tissue]
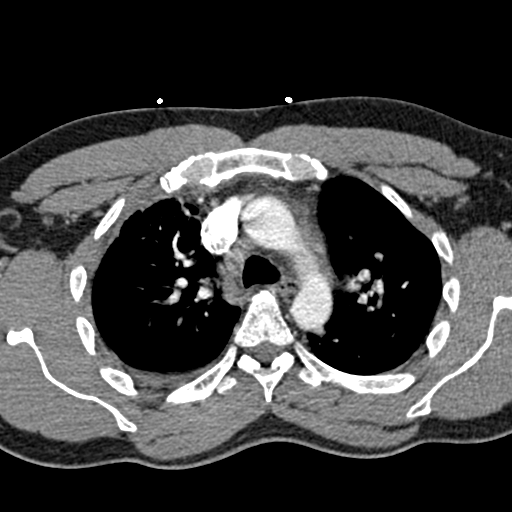
[im 148/227  lung]
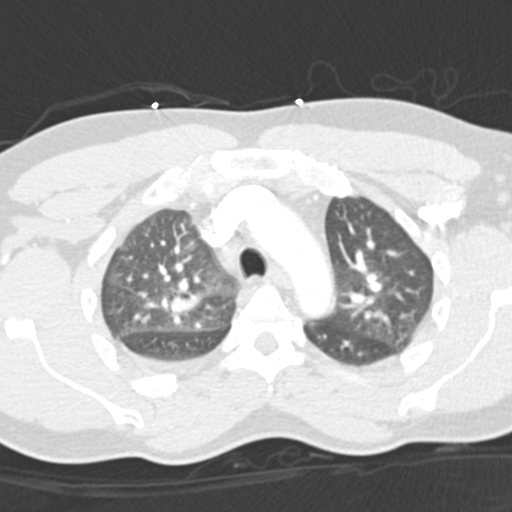
[im 158/227  soft-tissue]
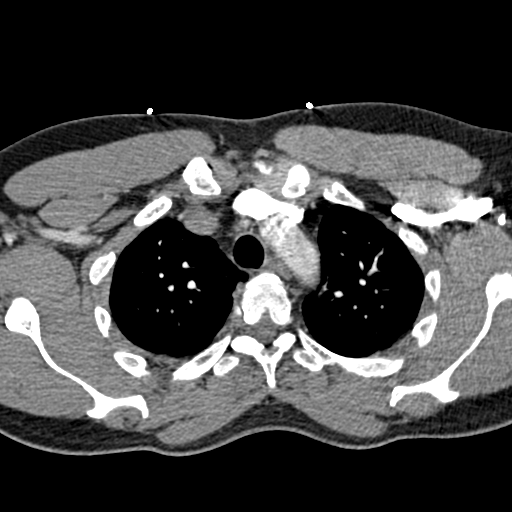
[im 168/227  lung]
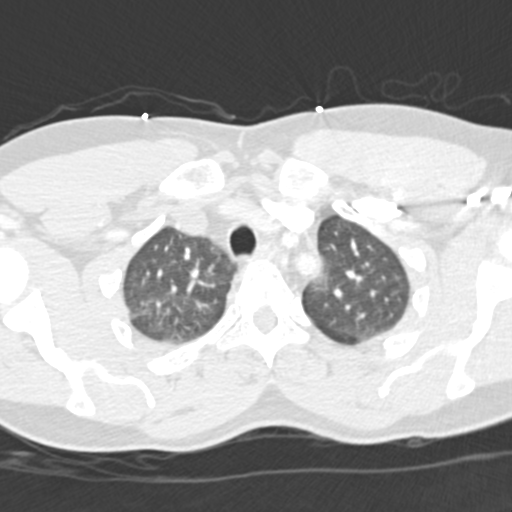
[im 177/227  soft-tissue]
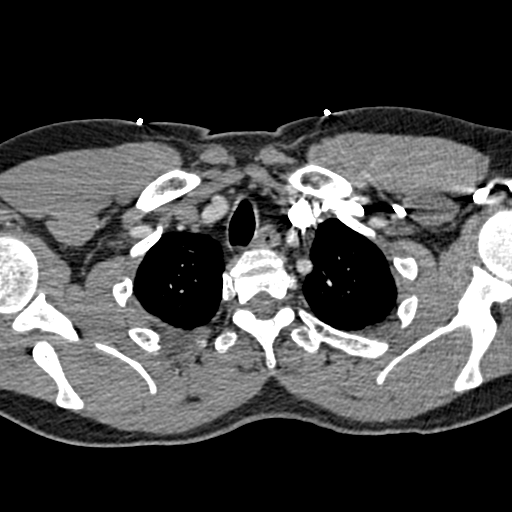
[im 197/227  lung]
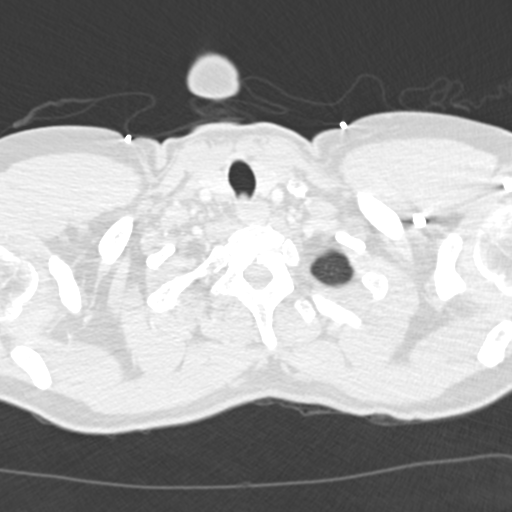
[im 207/227  soft-tissue]
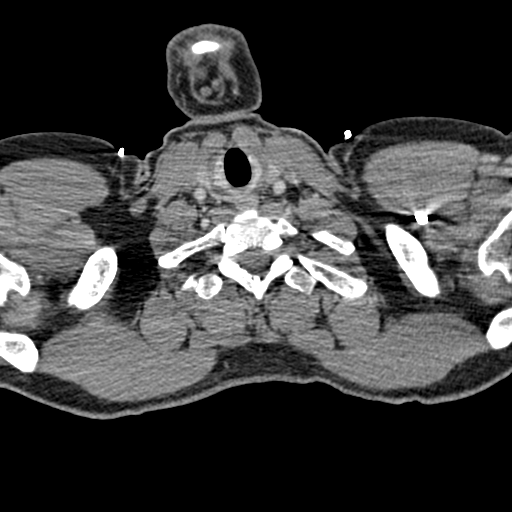
[im 217/227  lung]
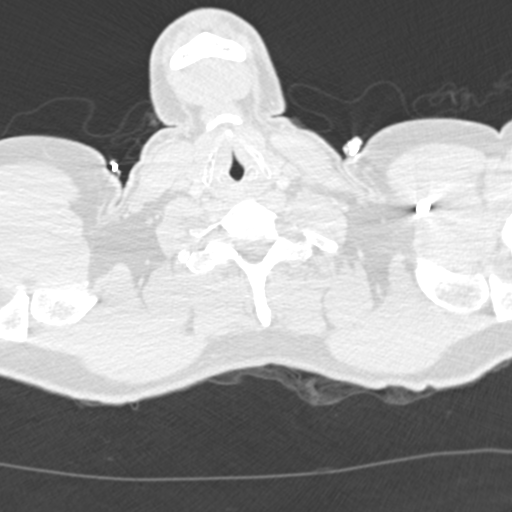

[19 of 32 positions shown; findings below may reference images not displayed]

FINDINGS: No filling defects are seen in the central, segmental or
subsegmental pulmonary arteries.  The heart is normal in size
without pericardial effusion.  The aorta and major branch vessels
are patent.  The thyroid is normal.  Borderline  mediastinal hilar
adenopathy is seen with a 11 mm right hilar lymph node and a 10 mm
pretracheal lymph node.  The trachea and central airways are
patent.  Consolidation is seen in the right greater than left lung
base.  There is also consolidation seen in the right middle lobe.
Small  right  pleural effusion is noted.  There is a loculated
component seen at all anteriorly adjacent to the right middle lobe.
Respiratory motion limits evaluation for sub centimeter pulmonary
nodules.  However, none are identified.  Diffuse low attenuation is
seen in the hepatic parenchyma compatible with steatosis.  No
aggressive lytic or blastic bony lesions are seen.

Review of the MIP images confirms the above findings.
IMPRESSION: 1.  No pulmonary embolism seen on today's exam.
2.  Bilateral right bilateral lower lobe consolidation, right
greater than left and consolidation of the right middle lobe
compatible with infection.  Small right pleural effusion is noted
with a small loculated component adjacent to the consolidation in
the right middle lobe.  Borderline adenopathy is detailed above
likely reactive from infectious process.

## 2013-04-20 ENCOUNTER — Emergency Department (HOSPITAL_COMMUNITY)
Admission: EM | Admit: 2013-04-20 | Discharge: 2013-04-21 | Disposition: A | Discharge status: Home / Self Care | Payer: Self-pay | Attending: Emergency Medicine | Admitting: Emergency Medicine

## 2013-04-20 ENCOUNTER — Emergency Department (HOSPITAL_COMMUNITY): Payer: Self-pay

## 2013-04-20 ENCOUNTER — Encounter (HOSPITAL_COMMUNITY): Payer: Self-pay | Admitting: Emergency Medicine

## 2013-04-20 DIAGNOSIS — Z8701 Personal history of pneumonia (recurrent): Secondary | ICD-10-CM | POA: Insufficient documentation

## 2013-04-20 DIAGNOSIS — E119 Type 2 diabetes mellitus without complications: Secondary | ICD-10-CM | POA: Insufficient documentation

## 2013-04-20 DIAGNOSIS — Z79899 Other long term (current) drug therapy: Secondary | ICD-10-CM | POA: Insufficient documentation

## 2013-04-20 DIAGNOSIS — R0789 Other chest pain: Secondary | ICD-10-CM | POA: Insufficient documentation

## 2013-04-20 LAB — BASIC METABOLIC PANEL
BUN: 13 mg/dL (ref 6–23)
CO2: 21 mEq/L (ref 19–32)
Calcium: 9.5 mg/dL (ref 8.4–10.5)
Chloride: 99 mEq/L (ref 96–112)
Creatinine, Ser: 0.84 mg/dL (ref 0.50–1.35)
GFR calc Af Amer: >90 mL/min (ref >90)
GFR calc non Af Amer: >90 mL/min (ref >90)
Glucose, Bld: 242 mg/dL — ABNORMAL HIGH (ref 70–99)
Potassium: 4.1 mEq/L (ref 3.5–5.1)
Sodium: 135 mEq/L (ref 135–145)

## 2013-04-20 LAB — GLUCOSE, CAPILLARY: Glucose-Capillary: 256 mg/dL — ABNORMAL HIGH (ref 70–99)

## 2013-04-20 LAB — POCT I-STAT TROPONIN I: Troponin i, poc: 0 ng/mL (ref 0.00–0.08)

## 2013-04-20 LAB — CBC
HCT: 43.4 % (ref 39.0–52.0)
Hemoglobin: 15.3 g/dL (ref 13.0–17.0)
MCH: 29.3 pg (ref 26.0–34.0)
MCHC: 35.3 g/dL (ref 30.0–36.0)
MCV: 83.1 fL (ref 78.0–100.0)
Platelets: 256 10*3/uL (ref 150–400)
RBC: 5.22 MIL/uL (ref 4.22–5.81)
RDW: 12 % (ref 11.5–15.5)
WBC: 8.7 10*3/uL (ref 4.0–10.5)

## 2013-04-20 MED ORDER — MORPHINE SULFATE 4 MG/ML IJ SOLN
4.0000 mg | Freq: Once | INTRAMUSCULAR | Status: AC
  Administered 2013-04-20: 4 mg via INTRAVENOUS
  Filled 2013-04-20: qty 1

## 2013-04-20 MED ORDER — KETOROLAC TROMETHAMINE 30 MG/ML IJ SOLN
30.0000 mg | Freq: Once | INTRAMUSCULAR | Status: AC
  Administered 2013-04-20: 30 mg via INTRAVENOUS
  Filled 2013-04-20: qty 1

## 2013-04-20 NOTE — ED Notes (Signed)
Patient c/o L sided chest pain radiating to L shoulder. Patient states he was sitting at home when pain started. Patient rating pain 7-8/10. Patient denies having had this pain before. Patient is diabetic, not on medications due to insurance

## 2013-04-20 NOTE — ED Notes (Signed)
Patient CBG 256 on arrival.

## 2013-04-21 LAB — D-DIMER, QUANTITATIVE: D-Dimer, Quant: 0.62 ug/mL-FEU — ABNORMAL HIGH (ref 0.00–0.48)

## 2013-04-21 LAB — POCT I-STAT TROPONIN I: Troponin i, poc: 0 ng/mL (ref 0.00–0.08)

## 2013-04-21 MED ORDER — NAPROXEN 500 MG PO TABS: 500.0000 mg | ORAL_TABLET | Freq: Two times a day (BID) | ORAL | Status: DC

## 2013-04-21 MED ORDER — METFORMIN HCL 500 MG PO TABS
500.0000 mg | ORAL_TABLET | Freq: Once | ORAL | Status: AC
  Administered 2013-04-21: 500 mg via ORAL
  Filled 2013-04-21: qty 1

## 2013-04-21 MED ORDER — METFORMIN HCL 500 MG PO TABS: 500.0000 mg | ORAL_TABLET | Freq: Two times a day (BID) | ORAL | Status: DC

## 2013-04-21 NOTE — ED Provider Notes (Signed)
History     CSN: [id number]  Arrival date & time 04/20/13  2216   First MD Initiated Contact with Patient 04/20/13 2303      Chief Complaint  Patient presents with  . Chest Pain    (Consider location/radiation/quality/duration/timing/severity/associated sxs/prior treatment) HPI 42 year old male presents to emergency room with complaint of chest pain.  Patient reports pain is left of sternum, radiates into his shoulder and down to his elbow.  Patient was laying in bed when pain came on.  Pain is rated at an 8.  No shortness of breath.  Taking a deep breath, however, does worsen the pain.  No nausea no diaphoresis.  Patient has history of diabetes, for which he does not take medication as he does not have insurance.  He reports that his blood sugar runs 130s to 150s.  Pain is worse with movement and palpation.  Patient reports past history of pneumonia about a year ago.  No family history of coronary disease.  Patient is nonsmoker.  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  Past Medical History  Diagnosis Date  . Atypical chest pain   . Type II or unspecified type diabetes mellitus without mention of complication, not stated as uncontrolled     History reviewed. No pertinent past surgical history.  Family History  Problem Relation Age of Onset  . Asthma Mother   . Diabetes Father   . Allergies Son   . Asthma Son     History  Substance Use Topics  . Smoking status: Never Smoker   . Smokeless tobacco: Not on file  . Alcohol Use: Yes      Review of Systems  All other systems reviewed and are negative.    Allergies  Review of patient's allergies indicates no known allergies.  Home Medications   Current Outpatient Rx  Name  Route  Sig  Dispense  Refill  . acetaminophen (TYLENOL) 325 MG tablet   Oral   Take 650 mg by mouth every 6 (six) hours as needed for pain (pain).         Marland Kitchen albuterol  (PROVENTIL HFA;VENTOLIN HFA) 108 (90 BASE) MCG/ACT inhaler   Inhalation   Inhale 1 puff into the lungs every 6 (six) hours as needed for wheezing (shortness of breath).           BP 132/80  Pulse 73  Temp(Src) 97.5 F (36.4 C) (Oral)  Resp 21  Ht 5' 8.5" (1.74 m)  Wt 195 lb (88.451 kg)  BMI 29.21 kg/m2  SpO2 99%  Physical Exam  Constitutional: He is oriented to person, place, and time. He appears well-developed and well-nourished. He appears distressed.  HENT:  Head: Normocephalic and atraumatic.  Nose: Nose normal.  Mouth/Throat: Oropharynx is clear and moist.  Eyes: Conjunctivae and EOM are normal. Pupils are equal, round, and reactive to light.  Neck: Normal range of motion. Neck supple. No JVD present. No tracheal deviation present. No thyromegaly present.  Cardiovascular: Normal rate, regular rhythm, normal heart sounds and intact distal pulses.  Exam reveals no gallop and no friction rub.   No murmur heard. Pulmonary/Chest: Effort normal and breath sounds normal. No stridor. No respiratory distress. He has no wheezes. He has no rales. He exhibits tenderness (Patient is very tender to palpation across the left anterior chest wall.).  Abdominal: Soft. Bowel sounds are normal. He exhibits no distension and no mass. There is no tenderness. There  is no rebound and no guarding.  Musculoskeletal: Normal range of motion. He exhibits tenderness (tenderness to range of motion of the left shoulder, and palpation to the anterior shoulder.). He exhibits no edema.  Lymphadenopathy:    He has no cervical adenopathy.  Neurological: He is alert and oriented to person, place, and time. He has normal reflexes. He exhibits normal muscle tone. Coordination normal.  Skin: Skin is warm and dry. No rash noted. No erythema. No pallor.  Psychiatric: He has a normal mood and affect. His behavior is normal. Judgment and thought content normal.    ED Course  Procedures (including critical care  time)  Labs Reviewed  BASIC METABOLIC PANEL - Abnormal; Notable for the following:    Glucose, Bld 242 (*)    All other components within normal limits  GLUCOSE, CAPILLARY - Abnormal; Notable for the following:    Glucose-Capillary 256 (*)    All other components within normal limits  D-DIMER, QUANTITATIVE - Abnormal; Notable for the following:    D-Dimer, Quant 0.62 (*)    All other components within normal limits  CBC  POCT I-STAT TROPONIN I   Dg Chest Port 1 View  04/20/2013   *RADIOLOGY REPORT*  Clinical Data: Chest pain and cough.  PORTABLE CHEST - 1 VIEW  Comparison: Chest x-ray 12/15/2010.  Findings: Lung volumes are low.  No acute consolidative air space disease.  No pleural effusions.  Mild cephalization of the pulmonary vasculature, without frank pulmonary edema.  Heart size is normal.  Mediastinal contours are unremarkable.  IMPRESSION: 1.  Low lung volumes with mild pulmonary venous congestion, but no frank pulmonary edema.   Original Report Authenticated By: Trudie Reed, M.D.    Date: 04/20/2013  Rate: 77  Rhythm: normal sinus rhythm  QRS Axis: normal  Intervals: normal  ST/T Wave abnormalities: nonspecific ST changes  Conduction Disutrbances:none  Narrative Interpretation:   Old EKG Reviewed: unchanged    1. Atypical chest pain   2. DIABETES MELLITUS, TYPE II       MDM  42 year old male history of diabetes, who presents with chest pain.  Chest pain is reproducible with palpation of the chest wall, and worse with deep breaths.  D-dimer is slightly elevated at 0.62.  Pt is having slight pleuritic chest pain, but more musculoskeletal pain.  Since DDimer is less than 1, feel that the literature reflects he does not require CTA at this time.  Will reassess for pain after toradol and morphine.  Will get second marker at 130 am.       Olivia Mackie, MD 04/21/13 [phone]

## 2013-08-06 ENCOUNTER — Emergency Department (HOSPITAL_COMMUNITY): Payer: BC Managed Care – PPO

## 2013-08-06 ENCOUNTER — Encounter (HOSPITAL_COMMUNITY): Payer: Self-pay | Admitting: Emergency Medicine

## 2013-08-06 ENCOUNTER — Emergency Department (HOSPITAL_COMMUNITY)
Admission: EM | Admit: 2013-08-06 | Discharge: 2013-08-07 | Disposition: A | Discharge status: Home / Self Care | Payer: BC Managed Care – PPO | Attending: Emergency Medicine | Admitting: Emergency Medicine

## 2013-08-06 DIAGNOSIS — R112 Nausea with vomiting, unspecified: Secondary | ICD-10-CM | POA: Insufficient documentation

## 2013-08-06 DIAGNOSIS — R5383 Other fatigue: Secondary | ICD-10-CM | POA: Insufficient documentation

## 2013-08-06 DIAGNOSIS — R5381 Other malaise: Secondary | ICD-10-CM | POA: Insufficient documentation

## 2013-08-06 DIAGNOSIS — R42 Dizziness and giddiness: Secondary | ICD-10-CM | POA: Insufficient documentation

## 2013-08-06 DIAGNOSIS — Z79899 Other long term (current) drug therapy: Secondary | ICD-10-CM | POA: Insufficient documentation

## 2013-08-06 DIAGNOSIS — E119 Type 2 diabetes mellitus without complications: Secondary | ICD-10-CM | POA: Insufficient documentation

## 2013-08-06 DIAGNOSIS — R55 Syncope and collapse: Secondary | ICD-10-CM

## 2013-08-06 DIAGNOSIS — R51 Headache: Secondary | ICD-10-CM | POA: Insufficient documentation

## 2013-08-06 DIAGNOSIS — H538 Other visual disturbances: Secondary | ICD-10-CM | POA: Insufficient documentation

## 2013-08-06 LAB — CBC WITH DIFFERENTIAL/PLATELET
Basophils Absolute: 0 10*3/uL (ref 0.0–0.1)
Basophils Relative: 0 % (ref 0–1)
Eosinophils Absolute: 0.1 10*3/uL (ref 0.0–0.7)
Eosinophils Relative: 1 % (ref 0–5)
HCT: 42 % (ref 39.0–52.0)
Hemoglobin: 14.7 g/dL (ref 13.0–17.0)
Lymphocytes Relative: 24 % (ref 12–46)
Lymphs Abs: 2.4 10*3/uL (ref 0.7–4.0)
MCH: 29.8 pg (ref 26.0–34.0)
MCHC: 35 g/dL (ref 30.0–36.0)
MCV: 85 fL (ref 78.0–100.0)
Monocytes Absolute: 0.6 10*3/uL (ref 0.1–1.0)
Monocytes Relative: 6 % (ref 3–12)
Neutro Abs: 6.8 10*3/uL (ref 1.7–7.7)
Neutrophils Relative %: 69 % (ref 43–77)
Platelets: 222 10*3/uL (ref 150–400)
RBC: 4.94 MIL/uL (ref 4.22–5.81)
RDW: 12.2 % (ref 11.5–15.5)
WBC: 9.9 10*3/uL (ref 4.0–10.5)

## 2013-08-06 LAB — COMPREHENSIVE METABOLIC PANEL
ALT: 47 U/L (ref 0–53)
AST: 36 U/L (ref 0–37)
Albumin: 3.8 g/dL (ref 3.5–5.2)
Alkaline Phosphatase: 85 U/L (ref 39–117)
BUN: 10 mg/dL (ref 6–23)
CO2: 27 mEq/L (ref 19–32)
Calcium: 9.3 mg/dL (ref 8.4–10.5)
Chloride: 100 mEq/L (ref 96–112)
Creatinine, Ser: 0.9 mg/dL (ref 0.50–1.35)
GFR calc Af Amer: >90 mL/min (ref >90)
GFR calc non Af Amer: >90 mL/min (ref >90)
Glucose, Bld: 154 mg/dL — ABNORMAL HIGH (ref 70–99)
Potassium: 3.7 mEq/L (ref 3.5–5.1)
Sodium: 136 mEq/L (ref 135–145)
Total Bilirubin: 0.8 mg/dL (ref 0.3–1.2)
Total Protein: 7.4 g/dL (ref 6.0–8.3)

## 2013-08-06 LAB — URINALYSIS, ROUTINE W REFLEX MICROSCOPIC
Bilirubin Urine: NEGATIVE
Glucose, UA: NEGATIVE mg/dL
Hgb urine dipstick: NEGATIVE
Ketones, ur: NEGATIVE mg/dL
Leukocytes, UA: NEGATIVE
Nitrite: NEGATIVE
Protein, ur: NEGATIVE mg/dL
Specific Gravity, Urine: 1.028 (ref 1.005–1.030)
Urobilinogen, UA: 0.2 mg/dL (ref 0.0–1.0)
pH: 6 (ref 5.0–8.0)

## 2013-08-06 LAB — RAPID URINE DRUG SCREEN, HOSP PERFORMED
Amphetamines: NOT DETECTED
Barbiturates: NOT DETECTED
Benzodiazepines: NOT DETECTED
Cocaine: NOT DETECTED
Opiates: NOT DETECTED
Tetrahydrocannabinol: NOT DETECTED

## 2013-08-06 LAB — GLUCOSE, CAPILLARY: Glucose-Capillary: 163 mg/dL — ABNORMAL HIGH (ref 70–99)

## 2013-08-06 MED ORDER — MECLIZINE HCL 25 MG PO TABS: 25.0000 mg | ORAL_TABLET | Freq: Three times a day (TID) | ORAL | Status: DC | PRN

## 2013-08-06 MED ORDER — SODIUM CHLORIDE 0.9 % IV BOLUS (SEPSIS)
1000.0000 mL | Freq: Once | INTRAVENOUS | Status: AC
  Administered 2013-08-06: 1000 mL via INTRAVENOUS

## 2013-08-06 MED ORDER — METFORMIN HCL 500 MG PO TABS: 500.0000 mg | ORAL_TABLET | Freq: Two times a day (BID) | ORAL | Status: DC

## 2013-08-06 MED ORDER — MECLIZINE HCL 25 MG PO TABS
25.0000 mg | ORAL_TABLET | Freq: Once | ORAL | Status: AC
  Administered 2013-08-06: 25 mg via ORAL
  Filled 2013-08-06: qty 1

## 2013-08-06 NOTE — ED Provider Notes (Signed)
CSN: [id number]     Arrival date & time 08/06/13  2043 History   First MD Initiated Contact with Patient 08/06/13 2117     Chief Complaint  Patient presents with  . Loss of Consciousness   (Consider location/radiation/quality/duration/timing/severity/associated sxs/prior Treatment) HPI Comments: Patient is a 42 year old male with history of diabetes who presents today after feeling generalized weakness, nausea, dizziness, and synopsized 3 times a day. He reports that he was out drinking mixed drinks last night and when he woke up this morning his blood sugar was 380. He took his metformin this morning due to his elevated blood sugar, but states that because he cannot afford the metformin he does not take it on a daily basis. He reports that when he syncopized earlier today he hit his head on the bathroom sink. He is unsure of how long he was unconscious for. He had an aura for his syncope. He last syncopized approximately 2 hours prior to arrival. He reports that a friend threw water on him to wake him up and that he was very confused. He is currently oriented x 3. He has associated blurry vision. He denies any shortness of breath, abdominal pain. He vomited earlier today. He is still nauseous. No recent illness, fevers, chills, focal weakness.   The history is provided by the patient. No language interpreter was used.    Past Medical History  Diagnosis Date  . Atypical chest pain   . Type II or unspecified type diabetes mellitus without mention of complication, not stated as uncontrolled    History reviewed. No pertinent past surgical history. Family History  Problem Relation Age of Onset  . Asthma Mother   . Diabetes Father   . Allergies Son   . Asthma Son    History  Substance Use Topics  . Smoking status: Never Smoker   . Smokeless tobacco: Not on file  . Alcohol Use: Yes    Review of Systems  Constitutional: Negative for fever and chills.  Eyes: Positive for visual disturbance  (blurry vision).  Respiratory: Negative for shortness of breath.   Cardiovascular: Negative for chest pain.  Gastrointestinal: Positive for nausea and vomiting. Negative for abdominal pain.  Neurological: Positive for headaches.  All other systems reviewed and are negative.    Allergies  Review of patient's allergies indicates no known allergies.  Home Medications   Current Outpatient Rx  Name  Route  Sig  Dispense  Refill  . acetaminophen (TYLENOL) 325 MG tablet   Oral   Take 650 mg by mouth every 6 (six) hours as needed for pain (headache).          . cetirizine (ZYRTEC) 10 MG tablet   Oral   Take 10 mg by mouth daily as needed for allergies.         . metFORMIN (GLUCOPHAGE) 500 MG tablet   Oral   Take 1 tablet (500 mg total) by mouth 2 (two) times daily with a meal.   30 tablet   0    BP 124/74  Pulse 78  Temp(Src) 98.9 F (37.2 C) (Oral)  Resp 18  Ht 5\' 8"  (1.727 m)  Wt 195 lb (88.451 kg)  BMI 29.66 kg/m2  SpO2 94% Physical Exam  Nursing note and vitals reviewed. Constitutional: He is oriented to person, place, and time. He appears well-developed and well-nourished. No distress.  HENT:  Head: Normocephalic and atraumatic.  Right Ear: External ear normal.  Left Ear: External ear normal.  Nose: Nose  normal.  Mouth/Throat: Uvula is midline, oropharynx is clear and moist and mucous membranes are normal.  Eyes: Conjunctivae and EOM are normal. Pupils are equal, round, and reactive to light.  Neck: Trachea normal, normal range of motion and phonation normal. No tracheal deviation present.  No nuchal rigidity or meningeal signs  Cardiovascular: Normal rate, regular rhythm, normal heart sounds, intact distal pulses and normal pulses.   Pulmonary/Chest: Effort normal and breath sounds normal. No stridor.  Abdominal: Soft. He exhibits no distension. There is no tenderness. There is no rigidity, no rebound and no guarding.  Musculoskeletal: Normal range of motion.   Neurological: He is alert and oriented to person, place, and time. He has normal strength. No sensory deficit. Coordination and gait normal.  Strength 5/5 in all extremities. Finger nose finger normal.   Skin: Skin is warm and dry. He is not diaphoretic.  Psychiatric: He has a normal mood and affect. His behavior is normal.    ED Course  Procedures (including critical care time) Labs Review Labs Reviewed  GLUCOSE, CAPILLARY - Abnormal; Notable for the following:    Glucose-Capillary 163 (*)    All other components within normal limits  COMPREHENSIVE METABOLIC PANEL - Abnormal; Notable for the following:    Glucose, Bld 154 (*)    All other components within normal limits  CBC WITH DIFFERENTIAL  URINALYSIS, ROUTINE W REFLEX MICROSCOPIC  URINE RAPID DRUG SCREEN (HOSP PERFORMED)    Date: 08/06/2013  Rate: 59  Rhythm: normal sinus rhythm  QRS Axis: normal  Intervals: normal  ST/T Wave abnormalities: nonspecific ST changes  Conduction Disutrbances:none  Narrative Interpretation:   Old EKG Reviewed: unchanged    Imaging Review Ct Head Wo Contrast  08/06/2013   CLINICAL DATA:  Recent traumatic injury and pain  EXAM: CT HEAD WITHOUT CONTRAST  TECHNIQUE: Contiguous axial images were obtained from the base of the skull through the vertex without intravenous contrast.  COMPARISON:  None.  FINDINGS: The bony calvarium is intact. No findings to suggest acute hemorrhage, acute infarction or space-occupying mass lesion are noted.  IMPRESSION: No acute intracranial abnormality.   Electronically Signed   By: Alcide Clever M.D.   On: 08/06/2013 21:56    MDM   1. Diabetes   2. Syncope   3. Dizziness    Patient presents to the emergency department after syncopised and 3 times today. Patient had an elevated blood sugar and is out of his metformin. I will provide pt with a refill. Discussed the importance of establishing care with a regular doctor. Head CT negative for acute intracranial  abnormality, EKG unchanged from prior, labs are unremarkable. Glucose found to be 154. Patient feels significantly improved after 1 L of fluid and meclizine. He was sent home with an rx for meclizine. Discussed case with Dr. Fonnie Jarvis who agrees with plan. Return instructions given. Vital signs stable for discharge. Patient / Family / Caregiver informed of clinical course, understand medical decision-making process, and agree with plan.   Mora Bellman, PA-C 08/07/13 0110

## 2013-08-06 NOTE — ED Notes (Addendum)
Pt reports that he is diabetic, was out drinking mixed drinks last night, does not remember the quantity and "passed out" this morning, hitting his head in the bathroom.  Pt reports he took his Metformin today because his CBG was elevated, but that he does not take this as prescribed.  Pt reports he "passed out" an additional 2 times today, last time x2 hours ago.  Pt reports he has a HA, blurred vision and feels weak and dizzy.

## 2013-08-10 NOTE — ED Provider Notes (Signed)
Medical screening examination/treatment/procedure(s) were performed by non-physician practitioner and as supervising physician I was immediately available for consultation/collaboration.  Hurman Horn, MD 08/10/13 2044

## 2014-10-11 ENCOUNTER — Encounter: Payer: Self-pay | Admitting: Internal Medicine

## 2014-10-11 ENCOUNTER — Ambulatory Visit (HOSPITAL_BASED_OUTPATIENT_CLINIC_OR_DEPARTMENT_OTHER): Payer: BC Managed Care – PPO | Admitting: *Deleted

## 2014-10-11 ENCOUNTER — Ambulatory Visit: Payer: BC Managed Care – PPO | Attending: Internal Medicine | Admitting: Internal Medicine

## 2014-10-11 VITALS — BP 133/89 | HR 66 | Temp 98.7°F | Resp 16 | Ht 68.5 in | Wt 200.0 lb

## 2014-10-11 DIAGNOSIS — Z0000 Encounter for general adult medical examination without abnormal findings: Secondary | ICD-10-CM

## 2014-10-11 DIAGNOSIS — Z79899 Other long term (current) drug therapy: Secondary | ICD-10-CM | POA: Insufficient documentation

## 2014-10-11 DIAGNOSIS — E119 Type 2 diabetes mellitus without complications: Secondary | ICD-10-CM | POA: Insufficient documentation

## 2014-10-11 DIAGNOSIS — Z23 Encounter for immunization: Secondary | ICD-10-CM

## 2014-10-11 LAB — LIPID PANEL
Cholesterol: 181 mg/dL (ref 0–200)
HDL: 28 mg/dL — ABNORMAL LOW (ref >39)
LDL Cholesterol: 119 mg/dL — ABNORMAL HIGH (ref 0–99)
Total CHOL/HDL Ratio: 6.5 Ratio
Triglycerides: 169 mg/dL — ABNORMAL HIGH (ref <150)
VLDL: 34 mg/dL (ref 0–40)

## 2014-10-11 LAB — COMPLETE METABOLIC PANEL WITH GFR
ALT: 31 U/L (ref 0–53)
AST: 22 U/L (ref 0–37)
Albumin: 4.2 g/dL (ref 3.5–5.2)
Alkaline Phosphatase: 82 U/L (ref 39–117)
BUN: 11 mg/dL (ref 6–23)
CO2: 25 mEq/L (ref 19–32)
Calcium: 9.2 mg/dL (ref 8.4–10.5)
Chloride: 104 mEq/L (ref 96–112)
Creat: 0.8 mg/dL (ref 0.50–1.35)
GFR, Est African American: >89 mL/min
GFR, Est Non African American: >89 mL/min
Glucose, Bld: 152 mg/dL — ABNORMAL HIGH (ref 70–99)
Potassium: 4.6 mEq/L (ref 3.5–5.3)
Sodium: 138 mEq/L (ref 135–145)
Total Bilirubin: 0.8 mg/dL (ref 0.2–1.2)
Total Protein: 7.5 g/dL (ref 6.0–8.3)

## 2014-10-11 LAB — CBC
HCT: 44.1 % (ref 39.0–52.0)
Hemoglobin: 15.8 g/dL (ref 13.0–17.0)
MCH: 29.7 pg (ref 26.0–34.0)
MCHC: 35.8 g/dL (ref 30.0–36.0)
MCV: 82.9 fL (ref 78.0–100.0)
MPV: 10.2 fL (ref 9.4–12.4)
Platelets: 250 10*3/uL (ref 150–400)
RBC: 5.32 MIL/uL (ref 4.22–5.81)
RDW: 12.7 % (ref 11.5–15.5)
WBC: 6.7 10*3/uL (ref 4.0–10.5)

## 2014-10-11 LAB — POCT GLYCOSYLATED HEMOGLOBIN (HGB A1C): Hemoglobin A1C: 6.5

## 2014-10-11 LAB — GLUCOSE, POCT (MANUAL RESULT ENTRY): POC Glucose: 161 mg/dl — AB (ref 70–99)

## 2014-10-11 MED ORDER — METFORMIN HCL 500 MG PO TABS: 500.0000 mg | ORAL_TABLET | Freq: Two times a day (BID) | ORAL | Status: DC

## 2014-10-11 MED ORDER — LISINOPRIL 5 MG PO TABS: 5.0000 mg | ORAL_TABLET | Freq: Every day | ORAL | Status: DC

## 2014-10-11 NOTE — Patient Instructions (Signed)
Diabetes and Foot Care Diabetes may cause you to have problems because of poor blood supply (circulation) to your feet and legs. This may cause the skin on your feet to become thinner, break easier, and heal more slowly. Your skin may become dry, and the skin may peel and crack. You may also have nerve damage in your legs and feet causing decreased feeling in them. You may not notice minor injuries to your feet that could lead to infections or more serious problems. Taking care of your feet is one of the most important things you can do for yourself.  HOME CARE INSTRUCTIONS  Wear shoes at all times, even in the house. Do not go barefoot. Bare feet are easily injured.  Check your feet daily for blisters, cuts, and redness. If you cannot see the bottom of your feet, use a mirror or ask someone for help.  Wash your feet with warm water (do not use hot water) and mild soap. Then pat your feet and the areas between your toes until they are completely dry. Do not soak your feet as this can dry your skin.  Apply a moisturizing lotion or petroleum jelly (that does not contain alcohol and is unscented) to the skin on your feet and to dry, brittle toenails. Do not apply lotion between your toes.  Trim your toenails straight across. Do not dig under them or around the cuticle. File the edges of your nails with an emery board or nail file.  Do not cut corns or calluses or try to remove them with medicine.  Wear clean socks or stockings every day. Make sure they are not too tight. Do not wear knee-high stockings since they may decrease blood flow to your legs.  Wear shoes that fit properly and have enough cushioning. To break in new shoes, wear them for just a few hours a day. This prevents you from injuring your feet. Always look in your shoes before you put them on to be sure there are no objects inside.  Do not cross your legs. This may decrease the blood flow to your feet.  If you find a minor scrape,  cut, or break in the skin on your feet, keep it and the skin around it clean and dry. These areas may be cleansed with mild soap and water. Do not cleanse the area with peroxide, alcohol, or iodine.  When you remove an adhesive bandage, be sure not to damage the skin around it.  If you have a wound, look at it several times a day to make sure it is healing.  Do not use heating pads or hot water bottles. They may burn your skin. If you have lost feeling in your feet or legs, you may not know it is happening until it is too late.  Make sure your health care provider performs a complete foot exam at least annually or more often if you have foot problems. Report any cuts, sores, or bruises to your health care provider immediately. SEEK MEDICAL CARE IF:   You have an injury that is not healing.  You have cuts or breaks in the skin.  You have an ingrown nail.  You notice redness on your legs or feet.  You feel burning or tingling in your legs or feet.  You have pain or cramps in your legs and feet.  Your legs or feet are numb.  Your feet always feel cold. SEEK IMMEDIATE MEDICAL CARE IF:   There is increasing redness,   swelling, or pain in or around a wound.  There is a red line that goes up your leg.  Pus is coming from a wound.  You develop a fever or as directed by your health care provider.  You notice a bad smell coming from an ulcer or wound. Document Released: 10/16/2000 Document Revised: 06/21/2013 Document Reviewed: 03/28/2013 Exit[employer] Patient Information 2015 [employer]. This information is not intended to replace advice given to you by your health care provider. Make sure you discuss any questions you have with your health care provider.  

## 2014-10-11 NOTE — Progress Notes (Signed)
Pt is here to establish care. Pt has a history of diabetes. Pt is requesting a physical.

## 2014-10-11 NOTE — Progress Notes (Signed)
Patient ID: Francisco Smith, male   DOB: 03/18/1971, 43 y.o.   MRN: [id number]  [id number]:[id number]  [id number]  DOB - 03/18/1971  CC:  Chief Complaint  Patient presents with  . Establish Care       HPI: Francisco Smith is a 43 y.o. male with a history of T2DM here today to establish medical care.  He reports that he was diagnosed with diabetes four years ago.  He reports that he has only been on Metformin 500 mg BID.  He is requesting a physical today.  Reports that he was on Meclizine for headaches, dizziness, and fatigue.    Surgical hx: none Social Hx: denies tobacco, drug, alcohol use.  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 Atypical chest pain   . Type II or unspecified type diabetes mellitus without mention of complication, not stated as uncontrolled    Current Outpatient Prescriptions on File Prior to Visit  Medication Sig Dispense Refill  . metFORMIN (GLUCOPHAGE) 500 MG tablet Take 1 tablet (500 mg total) by mouth 2 (two) times daily with a meal. 30 tablet 0  . acetaminophen (TYLENOL) 325 MG tablet Take 650 mg by mouth every 6 (six) hours as needed for pain (headache).     . cetirizine (ZYRTEC) 10 MG tablet Take 10 mg by mouth daily as needed for allergies.    Marland Kitchen. meclizine (ANTIVERT) 25 MG tablet Take 1 tablet (25 mg total) by mouth 3 (three) times daily as needed for dizziness. (Patient not taking: Reported on 10/11/2014) 28 tablet 0  . metFORMIN (GLUCOPHAGE) 500 MG tablet Take 1 tablet (500 mg total) by mouth 2 (two) times daily with a meal. (Patient not taking: Reported on 10/11/2014) 60 tablet 0   No current facility-administered medications on file prior to visit.   Family History  Problem Relation Age of Onset  . Asthma Mother   . Diabetes Father   . Allergies Son   . Asthma Son    History   Social  History  . Marital Status: Married    Spouse Name: N/A    Number of Children: N/A  . Years of Education: N/A   Occupational History  . Sales    Social History Main Topics  . Smoking status: Never Smoker   . Smokeless tobacco: Not on file  . Alcohol Use: Yes  . Drug Use: No  . Sexual Activity: Yes   Other Topics Concern  . Not on file   Social History Narrative    Review of Systems  Constitutional: Positive for malaise/fatigue.  Eyes: Positive for blurred vision (does not wear glasses often).  Respiratory: Negative.   Cardiovascular: Negative.   Gastrointestinal: Positive for blood in stool (Colonoscopy in 2010-normal).  Genitourinary: Negative.  Negative for frequency.  Musculoskeletal: Negative.   Skin: Negative.   Neurological: Positive for dizziness, tingling (LUE) and headaches.  Endo/Heme/Allergies: Positive for polydipsia.  Psychiatric/Behavioral: Negative.       Objective:   Filed Vitals:   10/11/14 0917  BP: 133/89  Pulse: 66  Temp: 98.7 F (37.1 C)  Resp: 16    Physical Exam: Constitutional: Patient appears well-developed and well-nourished. No distress. HENT: Normocephalic, atraumatic, External right and left ear normal. Oropharynx is clear and moist.  Eyes: Conjunctivae and EOM are normal. PERRLA, no scleral icterus. Neck: Normal ROM. Neck supple. No JVD. No  tracheal deviation. No thyromegaly. CVS: RRR, S1/S2 +, no murmurs, no gallops, no carotid bruit.  Pulmonary: Effort and breath sounds normal, no stridor, rhonchi, wheezes, rales.  Abdominal: Soft. BS +, no distension, tenderness, rebound or guarding.  Musculoskeletal: Normal range of motion. No edema and no tenderness.  Lymphadenopathy: No lymphadenopathy noted, cervical Neuro: Alert. Normal reflexes, muscle tone coordination. No cranial nerve deficit. Skin: Skin is warm and dry. No rash noted. Not diaphoretic. No erythema. No pallor. Psychiatric: Normal mood and affect. Behavior, judgment,  thought content normal.  Lab Results  Component Value Date   WBC 9.9 08/06/2013   HGB 14.7 08/06/2013   HCT 42.0 08/06/2013   MCV 85.0 08/06/2013   PLT 222 08/06/2013   Lab Results  Component Value Date   CREATININE 0.90 08/06/2013   BUN 10 08/06/2013   NA 136 08/06/2013   K 3.7 08/06/2013   CL 100 08/06/2013   CO2 27 08/06/2013    Lab Results  Component Value Date   HGBA1C 6.5 10/11/2014   Lipid Panel  No results found for: CHOL, TRIG, HDL, CHOLHDL, VLDL, LDLCALC     Assessment and plan:   Francisco Smith was seen today for establish care.  Diagnoses and associated orders for this visit:  Type 2 diabetes mellitus without complication - Glucose (CBG) - HgB A1c--6.5 - Refill metFORMIN (GLUCOPHAGE) 500 MG tablet; Take 1 tablet (500 mg total) by mouth 2 (two) times daily with a meal. - Begin lisinopril (PRINIVIL,ZESTRIL) 5 MG tablet; Take 1 tablet (5 mg total) by mouth daily. - CBC - COMPLETE METABOLIC PANEL WITH GFR - Lipid panel - Testosterone  Annual physical exam Explained routine health maintanence for men including annual influenza vaccine, pneumonia vaccine, prostate cancer screening after age 45, STD prevention and screening, Colonoscopy at age 45, smoking cessation, avoiding alcohol misuse  Need for prophylactic vaccination and inoculation against influenza Influenza injection received.  Explained side effects and contraindications to patient. Information sheet given to patient.    Return in about 3 months (around 01/10/2015) for Diabetes Mellitus.      Holland CommonsKECK, Dion Parrow, NP-C [facility] [phone] 10/11/2014, 9:31 AM

## 2014-10-12 ENCOUNTER — Other Ambulatory Visit: Payer: Self-pay

## 2014-10-12 LAB — TESTOSTERONE: Testosterone: 213 ng/dL — ABNORMAL LOW (ref 300–890)

## 2014-10-12 MED ORDER — GLUCOCOM LANCETS 28G MISC: Status: AC

## 2014-10-12 MED ORDER — GLUCOSE BLOOD VI STRP: ORAL_STRIP | Status: AC

## 2014-10-12 MED ORDER — FREESTYLE SYSTEM KIT: 1.0000 | PACK | Status: AC | PRN

## 2014-10-24 ENCOUNTER — Other Ambulatory Visit: Payer: Self-pay | Admitting: *Deleted

## 2014-10-24 ENCOUNTER — Telehealth: Payer: Self-pay | Admitting: *Deleted

## 2014-10-24 MED ORDER — ATORVASTATIN CALCIUM 20 MG PO TABS: 20.0000 mg | ORAL_TABLET | Freq: Every day | ORAL | Status: DC

## 2014-10-24 NOTE — Telephone Encounter (Signed)
Left voice message to return call Rx [facility]

## 2014-10-24 NOTE — Telephone Encounter (Signed)
-----   Message from Ambrose FinlandValerie A Keck, NP sent at 10/23/2014  1:55 PM EST ----- Cholesterol slightly elevated. Please provide appropriate education regarding diet and exercise.   Please send atorvastatin 20 mg QD in evening.  Please send 3 refills. Explain importance of cholesterol control as a diabetic---higher risk for heart disease and stroke. He has low testosterone and that may contribute to patient feeling fatigued and low sex drive

## 2015-03-21 ENCOUNTER — Ambulatory Visit: Payer: Self-pay

## 2015-09-05 ENCOUNTER — Ambulatory Visit: Payer: Self-pay | Attending: Internal Medicine | Admitting: Internal Medicine

## 2015-09-05 ENCOUNTER — Encounter: Payer: Self-pay | Admitting: Internal Medicine

## 2015-09-05 VITALS — BP 119/70 | HR 73 | Temp 98.0°F | Resp 16 | Wt 196.4 lb

## 2015-09-05 DIAGNOSIS — Z7984 Long term (current) use of oral hypoglycemic drugs: Secondary | ICD-10-CM | POA: Insufficient documentation

## 2015-09-05 DIAGNOSIS — E119 Type 2 diabetes mellitus without complications: Secondary | ICD-10-CM | POA: Insufficient documentation

## 2015-09-05 DIAGNOSIS — Z23 Encounter for immunization: Secondary | ICD-10-CM | POA: Insufficient documentation

## 2015-09-05 DIAGNOSIS — Z79899 Other long term (current) drug therapy: Secondary | ICD-10-CM | POA: Insufficient documentation

## 2015-09-05 LAB — GLUCOSE, POCT (MANUAL RESULT ENTRY): POC Glucose: 177 mg/dl — AB (ref 70–99)

## 2015-09-05 LAB — POCT GLYCOSYLATED HEMOGLOBIN (HGB A1C): Hemoglobin A1C: 6

## 2015-09-05 MED ORDER — METFORMIN HCL 500 MG PO TABS: 500.0000 mg | ORAL_TABLET | Freq: Two times a day (BID) | ORAL | Status: DC

## 2015-09-05 MED ORDER — LISINOPRIL 2.5 MG PO TABS: 2.5000 mg | ORAL_TABLET | Freq: Every day | ORAL | Status: DC

## 2015-09-05 NOTE — Progress Notes (Signed)
Patient here for follow up on his diabetes Patient states he is currently only taking metformin

## 2015-09-05 NOTE — Progress Notes (Signed)
Patient ID: Francisco Smith, male   DOB: Sep 03, 1971, 44 y.o.   MRN: [id number] SUBJECTIVE: 44 y.o. male for follow up of diabetes. Diabetic Review of Systems - medication compliance: compliant all of the time, diabetic diet compliance: compliant all of the time, home glucose monitoring: is performed regularly, fasting values range 109-120, further diabetic ROS: no chest pain, dyspnea or TIA's, no numbness, tingling or pain in extremities, no hypoglycemia, has noted excessive thirstiness and frequent urination.  Other symptoms and concerns: Having headaches maybe twice monthly that are throbbing. He usually goes to sleep with the headache but often wakes up with it as well. He has tried tylenol or Advil as needed which works well. Has been urinating often during the day while at work. He denies nocturia or weak urine stream.   Current Outpatient Prescriptions  Medication Sig Dispense Refill  . metFORMIN (GLUCOPHAGE) 500 MG tablet Take 1 tablet (500 mg total) by mouth 2 (two) times daily with a meal. 30 tablet 0  . acetaminophen (TYLENOL) 325 MG tablet Take 650 mg by mouth every 6 (six) hours as needed for pain (headache).     Marland Kitchen atorvastatin (LIPITOR) 20 MG tablet Take 1 tablet (20 mg total) by mouth daily. Every Evenings 90 tablet 0  . cetirizine (ZYRTEC) 10 MG tablet Take 10 mg by mouth daily as needed for allergies.    . GlucoCom Lancets MISC Check blood sugar TID & QHS 100 each 0  . glucose blood (CHOICE DM FORA G20 TEST STRIPS) test strip Use as instructed 100 each 12  . glucose monitoring kit (FREESTYLE) monitoring kit 1 each by Does not apply route as needed for other. Check blood sugar TID & QHS 1 each 0  . lisinopril (PRINIVIL,ZESTRIL) 5 MG tablet Take 1 tablet (5 mg total) by mouth daily. 30 tablet 3  . meclizine (ANTIVERT) 25 MG tablet Take 1 tablet (25 mg total) by mouth 3 (three) times daily as needed for dizziness. (Patient not taking: Reported on 10/11/2014) 28 tablet 0  . metFORMIN  (GLUCOPHAGE) 500 MG tablet Take 1 tablet (500 mg total) by mouth 2 (two) times daily with a meal. 60 tablet 4   No current facility-administered medications for this visit.    OBJECTIVE: Appearance: alert, well appearing, and in no distress, oriented to person, place, and time and normal appearing weight. BP 119/70 mmHg  Pulse 73  Temp(Src) 98 F (36.7 C)  Resp 16  Wt 196 lb 6.4 oz (89.086 kg)  SpO2 100%  Exam: heart sounds normal rate, regular rhythm, normal S1, S2, no murmurs, rubs, clicks or gallops, no JVD, chest clear, no carotid bruits, feet: warm, good capillary refill, no trophic changes or ulcerative lesions, normal DP and PT pulses, normal monofilament exam and normal sensory exam  ASSESSMENT: Diabetes Mellitus: well controlled. Metformin and low dose Lisinopril refilled.    PLAN: See orders for this visit as documented in the electronic medical record. Issues reviewed with him: diabetic diet discussed in detail, written exchange diet given, low cholesterol diet, weight control and daily exercise discussed, all medications, side effects and compliance discussed carefully, foot care discussed and Podiatry visits discussed and annual eye examinations at Ophthalmology discussed.  Return in about 6 months (around 03/04/2016) for Diabetes Mellitus and 1 week lab visit.  Lance Bosch, NP 09/05/2015 5:59 PM

## 2015-09-06 LAB — MICROALBUMIN, URINE: Microalb, Ur: <0.2 mg/dL

## 2015-09-11 ENCOUNTER — Ambulatory Visit: Payer: Self-pay

## 2015-09-12 ENCOUNTER — Other Ambulatory Visit: Payer: Self-pay

## 2015-09-24 ENCOUNTER — Ambulatory Visit: Payer: Self-pay | Attending: Internal Medicine

## 2015-10-10 ENCOUNTER — Emergency Department (HOSPITAL_COMMUNITY)
Admission: EM | Admit: 2015-10-10 | Discharge: 2015-10-10 | Disposition: A | Discharge status: Home / Self Care | Payer: Self-pay | Attending: Emergency Medicine | Admitting: Emergency Medicine

## 2015-10-10 ENCOUNTER — Encounter (HOSPITAL_COMMUNITY): Payer: Self-pay | Admitting: Emergency Medicine

## 2015-10-10 DIAGNOSIS — E119 Type 2 diabetes mellitus without complications: Secondary | ICD-10-CM | POA: Insufficient documentation

## 2015-10-10 DIAGNOSIS — M546 Pain in thoracic spine: Secondary | ICD-10-CM | POA: Insufficient documentation

## 2015-10-10 DIAGNOSIS — M436 Torticollis: Secondary | ICD-10-CM

## 2015-10-10 DIAGNOSIS — M5382 Other specified dorsopathies, cervical region: Secondary | ICD-10-CM | POA: Insufficient documentation

## 2015-10-10 DIAGNOSIS — Z79899 Other long term (current) drug therapy: Secondary | ICD-10-CM | POA: Insufficient documentation

## 2015-10-10 DIAGNOSIS — M25619 Stiffness of unspecified shoulder, not elsewhere classified: Secondary | ICD-10-CM | POA: Insufficient documentation

## 2015-10-10 DIAGNOSIS — Z7984 Long term (current) use of oral hypoglycemic drugs: Secondary | ICD-10-CM | POA: Insufficient documentation

## 2015-10-10 MED ORDER — KETOROLAC TROMETHAMINE 60 MG/2ML IM SOLN
60.0000 mg | Freq: Once | INTRAMUSCULAR | Status: AC
  Administered 2015-10-10: 60 mg via INTRAMUSCULAR
  Filled 2015-10-10: qty 2

## 2015-10-10 MED ORDER — DIAZEPAM 5 MG PO TABS
5.0000 mg | ORAL_TABLET | Freq: Once | ORAL | Status: AC
  Administered 2015-10-10: 5 mg via ORAL
  Filled 2015-10-10: qty 1

## 2015-10-10 MED ORDER — CYCLOBENZAPRINE HCL 10 MG PO TABS: 10.0000 mg | ORAL_TABLET | Freq: Two times a day (BID) | ORAL | Status: DC | PRN

## 2015-10-10 NOTE — ED Provider Notes (Signed)
CSN: [id number]     Arrival date & time 10/10/15  1140 History  By signing my name below, I, Francisco Smith, attest that this documentation has been prepared under the direction and in the presence of  [employer], PA-C. Electronically Signed: Hansel Smith, ED Scribe. 10/10/2015. 2:07 PM.    Chief Complaint  Patient presents with  . Torticollis  . Back Pain   The history is provided by the patient. No language interpreter was used.    HPI Comments: Francisco Smith is a 44 y.o. male who presents to the Emergency Department complaining of moderate, gradually worsening upper back pain and neck stiffness onset 3 days ago in the afternoon and worsened yesterday. He states difficulty turning his head to either side secondary to stiffness. Pt states that pain is worsened with movement. Pt states he has tried ice compress, heat compress, topical pain ointment, tylenol and ibuprofen with mild to moderate relief. He states h/o similar symptoms and neck stiffness, but not as severe or long lasting. He denies HA, fever, rash, sore throat.   Past Medical History  Diagnosis Date  . Atypical chest pain   . Type II or unspecified type diabetes mellitus without mention of complication, not stated as uncontrolled    History reviewed. No pertinent past surgical history. Family History  Problem Relation Age of Onset  . Asthma Mother   . Diabetes Father   . Allergies Son   . Asthma Son    Social History  Substance Use Topics  . Smoking status: Never Smoker   . Smokeless tobacco: Never Used  . Alcohol Use: Yes     Comment: "socially"    Review of Systems A 10 point review of systems was completed and was negative except for pertinent positives and negatives as mentioned in the history of present illness.   Allergies  Review of patient's allergies indicates no known allergies.  Home Medications   Prior to Admission medications   Medication Sig Start Date End Date Taking? Authorizing Provider   acetaminophen (TYLENOL) 325 MG tablet Take 650 mg by mouth every 6 (six) hours as needed for pain (headache).     Historical Provider, MD  atorvastatin (LIPITOR) 20 MG tablet Take 1 tablet (20 mg total) by mouth daily. Every Evenings 10/24/14   Lance Bosch, NP  cetirizine (ZYRTEC) 10 MG tablet Take 10 mg by mouth daily as needed for allergies.    Historical Provider, MD  cyclobenzaprine (FLEXERIL) 10 MG tablet Take 1 tablet (10 mg total) by mouth 2 (two) times daily as needed for muscle spasms. 10/10/15   Comer Locket, PA-C  GlucoCom Lancets MISC Check blood sugar TID & QHS 10/12/14   Lance Bosch, NP  glucose blood (CHOICE DM FORA G20 TEST STRIPS) test strip Use as instructed 10/12/14   Lance Bosch, NP  glucose monitoring kit (FREESTYLE) monitoring kit 1 each by Does not apply route as needed for other. Check blood sugar TID & QHS 10/12/14   Lance Bosch, NP  lisinopril (PRINIVIL,ZESTRIL) 2.5 MG tablet Take 1 tablet (2.5 mg total) by mouth daily. 09/05/15   Lance Bosch, NP  meclizine (ANTIVERT) 25 MG tablet Take 1 tablet (25 mg total) by mouth 3 (three) times daily as needed for dizziness. Patient not taking: Reported on 10/11/2014 08/06/13   Cleatrice Burke, PA-C  metFORMIN (GLUCOPHAGE) 500 MG tablet Take 1 tablet (500 mg total) by mouth 2 (two) times daily with a meal. 09/05/15   Mateo Flow  A Keck, NP   BP 128/76 mmHg  Pulse 50  Temp(Src) 97.6 F (36.4 C) (Oral)  Resp 16  SpO2 100% Physical Exam  Constitutional: He is oriented to person, place, and time. He appears well-developed and well-nourished.  Awake, alert, nontoxic appearance.    HENT:  Head: Normocephalic and atraumatic.  Eyes: Conjunctivae and EOM are normal. Pupils are equal, round, and reactive to light.  Neck: Neck supple.  No meningismus or nuchal rigidity. ROM of C spine limited secondary to pain.   Cardiovascular: Normal rate, regular rhythm and normal heart sounds.  Exam reveals no gallop and no friction rub.    No murmur heard. Heart sounds normal. RRR.    Pulmonary/Chest: Effort normal and breath sounds normal. No respiratory distress. He has no rales.  Lungs CTA bilaterally.   Abdominal: Soft. Bowel sounds are normal. He exhibits no distension. There is no tenderness.  Musculoskeletal: Normal range of motion.  Tenderness diffusely to bilateral trapezius and occipital musculature. There is a left trapezial spasm.   Neurological: He is alert and oriented to person, place, and time.  Skin: Skin is warm and dry. No rash noted.  No rash noted.   Psychiatric: He has a normal mood and affect. His behavior is normal.  Nursing note and vitals reviewed.  ED Course  Procedures (including critical care time) DIAGNOSTIC STUDIES: Oxygen Saturation is 95% on RA, adequate by my interpretation.    COORDINATION OF CARE: 1:58 PM Discussed treatment plan with pt at bedside and pt agreed to plan. Manage pain in the ED. Will prescribe muscle relaxer.    Meds given in ED:  Medications  ketorolac (TORADOL) injection 60 mg (60 mg Intramuscular Given 10/10/15 1450)  diazepam (VALIUM) tablet 5 mg (5 mg Oral Given 10/10/15 1450)    Discharge Medication List as of 10/10/2015  3:17 PM    START taking these medications   Details  cyclobenzaprine (FLEXERIL) 10 MG tablet Take 1 tablet (10 mg total) by mouth 2 (two) times daily as needed for muscle spasms., Starting 10/10/2015, Until Discontinued, Print         Filed Vitals:   10/10/15 1218 10/10/15 1500  BP: 140/79 128/76  Pulse: 64 50  Temp: 99.2 F (37.3 C) 97.6 F (36.4 C)  Resp: 17 16    MDM   Final diagnoses:  Neck stiffness  Shoulder stiffness, unspecified laterality   Pt with neck stiffness and upper back pain. No meningismus or nuchal rigidity. No red flags. No imaging is indicated at this time. Reports relief with IM Toradol and oral Valium in the ED. Pt advised to follow up with PCP. Conservative therapy recommended and discussed. Patient will  be discharged home & is agreeable with above plan. Returns precautions discussed. Pt appears safe for discharge. Pt advised to manage pain with prescribed muscle relaxer and anti-inflammatory. Discussed precautions for muscle relaxer.  I personally performed the services described in this documentation, which was scribed in my presence. The recorded information has been reviewed and is accurate.   Comer Locket, PA-C 10/10/15 1605  Gareth Morgan, MD 10/11/15 [phone]

## 2015-10-10 NOTE — ED Notes (Signed)
Pt from home with c/o a stiff neck radiating to his shoulder blades since this past Monday.  Pt denies fevers, N/V/D, or chills.  NAD, A&O.

## 2015-10-10 NOTE — Discharge Instructions (Signed)
Please continue taking her medications as prescribed. Take your muscle relaxer/Flexeril at night. Do not take this medication before driving or operating machinery. If you begin to develop fevers, headaches, rash, vision changes, numbness or weakness it is important for you to return to the emergency Department immediately for reevaluation. Follow-up with your doctor in the next week.

## 2015-12-06 ENCOUNTER — Ambulatory Visit: Payer: Self-pay | Admitting: Internal Medicine

## 2015-12-13 ENCOUNTER — Encounter: Payer: Self-pay | Admitting: Internal Medicine

## 2015-12-13 ENCOUNTER — Ambulatory Visit: Payer: Self-pay | Attending: Internal Medicine | Admitting: Internal Medicine

## 2015-12-13 VITALS — BP 114/79 | HR 60 | Temp 98.0°F | Resp 16 | Ht 69.0 in | Wt 201.0 lb

## 2015-12-13 DIAGNOSIS — Z7984 Long term (current) use of oral hypoglycemic drugs: Secondary | ICD-10-CM | POA: Insufficient documentation

## 2015-12-13 DIAGNOSIS — Z79899 Other long term (current) drug therapy: Secondary | ICD-10-CM | POA: Insufficient documentation

## 2015-12-13 DIAGNOSIS — E785 Hyperlipidemia, unspecified: Secondary | ICD-10-CM | POA: Insufficient documentation

## 2015-12-13 DIAGNOSIS — T148XXA Other injury of unspecified body region, initial encounter: Secondary | ICD-10-CM

## 2015-12-13 DIAGNOSIS — R220 Localized swelling, mass and lump, head: Secondary | ICD-10-CM

## 2015-12-13 DIAGNOSIS — T148 Other injury of unspecified body region: Secondary | ICD-10-CM

## 2015-12-13 DIAGNOSIS — M7989 Other specified soft tissue disorders: Secondary | ICD-10-CM | POA: Insufficient documentation

## 2015-12-13 DIAGNOSIS — K047 Periapical abscess without sinus: Secondary | ICD-10-CM | POA: Insufficient documentation

## 2015-12-13 DIAGNOSIS — E119 Type 2 diabetes mellitus without complications: Secondary | ICD-10-CM | POA: Insufficient documentation

## 2015-12-13 LAB — BASIC METABOLIC PANEL
BUN: 12 mg/dL (ref 7–25)
CO2: 25 mmol/L (ref 20–31)
Calcium: 9.4 mg/dL (ref 8.6–10.3)
Chloride: 103 mmol/L (ref 98–110)
Creat: 1.01 mg/dL (ref 0.60–1.35)
Glucose, Bld: 140 mg/dL — ABNORMAL HIGH (ref 65–99)
Potassium: 4.6 mmol/L (ref 3.5–5.3)
Sodium: 137 mmol/L (ref 135–146)

## 2015-12-13 LAB — LIPID PANEL
Cholesterol: 195 mg/dL (ref 125–200)
HDL: 24 mg/dL — ABNORMAL LOW (ref >=40)
LDL Cholesterol: 132 mg/dL — ABNORMAL HIGH (ref <130)
Total CHOL/HDL Ratio: 8.1 Ratio — ABNORMAL HIGH (ref <=5.0)
Triglycerides: 197 mg/dL — ABNORMAL HIGH (ref <150)
VLDL: 39 mg/dL — ABNORMAL HIGH (ref <30)

## 2015-12-13 LAB — GLUCOSE, POCT (MANUAL RESULT ENTRY): POC Glucose: 144 mg/dl — AB (ref 70–99)

## 2015-12-13 LAB — POCT GLYCOSYLATED HEMOGLOBIN (HGB A1C): Hemoglobin A1C: 6.4

## 2015-12-13 MED ORDER — MELOXICAM 15 MG PO TABS: 15.0000 mg | ORAL_TABLET | Freq: Every day | ORAL | Status: DC

## 2015-12-13 MED ORDER — ATORVASTATIN CALCIUM 20 MG PO TABS: 20.0000 mg | ORAL_TABLET | Freq: Every day | ORAL | Status: DC

## 2015-12-13 MED ORDER — LISINOPRIL 2.5 MG PO TABS: 2.5000 mg | ORAL_TABLET | Freq: Every day | ORAL | Status: DC

## 2015-12-13 MED ORDER — AMOXICILLIN 500 MG PO CAPS: 500.0000 mg | ORAL_CAPSULE | Freq: Three times a day (TID) | ORAL | Status: DC

## 2015-12-13 MED ORDER — METFORMIN HCL 500 MG PO TABS: 500.0000 mg | ORAL_TABLET | Freq: Two times a day (BID) | ORAL | Status: DC

## 2015-12-13 MED FILL — AMOXICILLIN 500 MG CAPSULE: 500 | 10 days supply | Qty: 30 | Fill #0

## 2015-12-13 MED FILL — MELOXICAM 15 MG TABLET: 15 | 30 days supply | Qty: 30 | Fill #0

## 2015-12-13 MED FILL — ATORVASTATIN 20 MG TABLET: 20 | 30 days supply | Qty: 30 | Fill #0

## 2015-12-13 MED FILL — LISINOPRIL 2.5 MG TABLET: 2.5 | 30 days supply | Qty: 30 | Fill #0

## 2015-12-13 MED FILL — metFORMIN HCL 500 MG TABS: 500 | 30 days supply | Qty: 60 | Fill #0

## 2015-12-13 NOTE — Progress Notes (Signed)
Patient complains of having some swelling to the right side of his face Also having some pain to his upper leg on the right side for the last three weeks

## 2015-12-13 NOTE — Progress Notes (Signed)
Patient ID: Francisco Smith, male   DOB: 1971-09-02, 45 y.o.   MRN: [id number] SUBJECTIVE: 45 y.o. male for follow up of diabetes. Diabetic Review of Systems - medication compliance: compliant all of the time, diabetic diet compliance: compliant all of the time, home glucose monitoring: is performed sporadically, further diabetic ROS: no polyuria or polydipsia, no chest pain, dyspnea or TIA's, no numbness, tingling or pain in extremities, no unusual visual symptoms, no hypoglycemia.  Other symptoms and concerns: Swelling on right side of face that began on Tuesday. He denies toothache. Slight throbbing pain from his head to his jaw. Right leg pain that goes from above knee to groin. He noticed pain 3 weeks ago when he attempted to lift his leg up to tie his shoe. No swelling of leg. Pain aggravated by crossing legs or squatting.  Current Outpatient Prescriptions  Medication Sig Dispense Refill  . acetaminophen (TYLENOL) 325 MG tablet Take 650 mg by mouth every 6 (six) hours as needed for pain (headache).     Marland Kitchen atorvastatin (LIPITOR) 20 MG tablet Take 1 tablet (20 mg total) by mouth daily. Every Evenings 90 tablet 0  . cetirizine (ZYRTEC) 10 MG tablet Take 10 mg by mouth daily as needed for allergies.    . cyclobenzaprine (FLEXERIL) 10 MG tablet Take 1 tablet (10 mg total) by mouth 2 (two) times daily as needed for muscle spasms. 20 tablet 0  . lisinopril (PRINIVIL,ZESTRIL) 2.5 MG tablet Take 1 tablet (2.5 mg total) by mouth daily. 30 tablet 3  . metFORMIN (GLUCOPHAGE) 500 MG tablet Take 1 tablet (500 mg total) by mouth 2 (two) times daily with a meal. 60 tablet 4  . GlucoCom Lancets MISC Check blood sugar TID & QHS 100 each 0  . glucose blood (CHOICE DM FORA G20 TEST STRIPS) test strip Use as instructed 100 each 12  . glucose monitoring kit (FREESTYLE) monitoring kit 1 each by Does not apply route as needed for other. Check blood sugar TID & QHS 1 each 0  . meclizine (ANTIVERT) 25 MG tablet Take 1  tablet (25 mg total) by mouth 3 (three) times daily as needed for dizziness. (Patient not taking: Reported on 10/11/2014) 28 tablet 0   No current facility-administered medications for this visit.  Review of Systems: Other than what is stated in HPI, all other systems are negative.   OBJECTIVE: Appearance: alert, well appearing, and in no distress, oriented to person, place, and time and normal appearing weight. BP 114/79 mmHg  Pulse 60  Temp(Src) 98 F (36.7 C)  Resp 16  Ht _0  (1.753 m)  Wt 201 lb (91.173 kg)  BMI 29.67 kg/m2  SpO2 100%  Exam: heart sounds normal rate, regular rhythm, normal S1, S2, no murmurs, rubs, clicks or gallops, no JVD, chest clear, no hepatosplenomegaly, no carotid bruits. Mild swelling in right lower gums, very mild right sided facial swelling. No hernia or tenderness in groin. Full ROM of RLE  ASSESSMENT: Francisco Smith was seen today for follow-up.  Diagnoses and all orders for this visit:  Type 2 diabetes mellitus without complication, without long-term current use of insulin (HCC) -     Glucose (CBG) -     HgB A1c -     lisinopril (PRINIVIL,ZESTRIL) 2.5 MG tablet; Take 1 tablet (2.5 mg total) by mouth daily. -     metFORMIN (GLUCOPHAGE) 500 MG tablet; Take 1 tablet (500 mg total) by mouth 2 (two) times daily with a meal. -  Lipid panel -     PSA -     Basic Metabolic Panel Patients diabetes is well control as evidence by consistently low a1c.  Patient will continue with current therapy and continue to make necessary lifestyle changes.  Reviewed foot care, diet, exercise, annual health maintenance with patient.   HLD (hyperlipidemia) -     atorvastatin (LIPITOR) 20 MG tablet; Take 1 tablet (20 mg total) by mouth daily. Every Evenings Education provided on proper lifestyle changes in order to lower cholesterol. Patient advised to maintain healthy weight and to keep total fat intake at 25-35% of total calories and carbohydrates 50-60% of total daily  calories. Explained how high cholesterol places patient at risk for heart disease. Patient placed on appropriate medication and repeat labs in 6 months   Facial swelling -     amoxicillin (AMOXIL) 500 MG capsule; Take 1 capsule (500 mg total) by mouth 3 (three) times daily. Patient has mild gum swelling to right lower gums. I will treat facial swelling as dental infection. Advised that he schedules a dental appointment.   Muscle strain -     meloxicam (MOBIC) 15 MG tablet; Take 1 tablet (15 mg total) by mouth daily. Rest, heat, and NSAID's   Return in about 6 months (around 06/11/2016) for Diabetes Mellitus.   Lance Bosch, NP 12/13/2015 10:34 AM

## 2015-12-14 LAB — PSA: PSA: 0.39 ng/mL (ref <=4.00)

## 2015-12-16 ENCOUNTER — Telehealth: Payer: Self-pay

## 2015-12-16 NOTE — Telephone Encounter (Signed)
Tried to contact patient this am Patient not available  Message left on voice mail to return our call 

## 2015-12-16 NOTE — Telephone Encounter (Signed)
-----   Message from Ambrose Finland, NP sent at 12/16/2015  8:41 AM EST ----- Please find out if he has been taking the Atorvastatin daily. His cholesterol is much higher that it was last year. If he is taking his medication as directed please increase his Atorvastatin to 40 mg daily. Please go over specific diet changes with patient

## 2015-12-16 NOTE — Telephone Encounter (Signed)
Returned patient phone call Patient not available Message left on voice mail to return our call 

## 2015-12-16 NOTE — Telephone Encounter (Signed)
Pt. Returned call. Please f/u with pt. °

## 2015-12-18 ENCOUNTER — Telehealth: Payer: Self-pay

## 2015-12-18 MED ORDER — ATORVASTATIN CALCIUM 40 MG PO TABS: 40.0000 mg | ORAL_TABLET | Freq: Every day | ORAL | Status: DC

## 2015-12-18 NOTE — Telephone Encounter (Signed)
Patient returned phone call and is aware of his lab results [facility]

## 2016-06-05 ENCOUNTER — Ambulatory Visit: Payer: Self-pay

## 2017-10-27 ENCOUNTER — Ambulatory Visit (HOSPITAL_COMMUNITY)
Admission: EM | Admit: 2017-10-27 | Discharge: 2017-10-27 | Disposition: A | Discharge status: Home / Self Care | Payer: Self-pay | Attending: Family Medicine | Admitting: Family Medicine

## 2017-10-27 ENCOUNTER — Ambulatory Visit (INDEPENDENT_AMBULATORY_CARE_PROVIDER_SITE_OTHER): Payer: Self-pay

## 2017-10-27 ENCOUNTER — Encounter (HOSPITAL_COMMUNITY): Payer: Self-pay | Admitting: Emergency Medicine

## 2017-10-27 DIAGNOSIS — M62830 Muscle spasm of back: Secondary | ICD-10-CM

## 2017-10-27 DIAGNOSIS — R05 Cough: Secondary | ICD-10-CM

## 2017-10-27 MED ORDER — CYCLOBENZAPRINE HCL 10 MG PO TABS: 10.0000 mg | ORAL_TABLET | Freq: Every day | ORAL | 0 refills | Status: AC

## 2017-10-27 NOTE — ED Provider Notes (Signed)
[facility]    CSN: [id number] Arrival date & time: 10/27/17  1047     History   Chief Complaint Chief Complaint  Patient presents with  . Back Pain    HPI GORMAN SAFI is a 46 y.o. male with history of HTN and DM presenting with acute back pain for 4 days. Pain began Sunday evening, has continued to worsen. Describes as the middle of the back and rib pain, worse on left. Pain is intermittent described a sharp stabbing, worsened with breathing and coughing. Has had a dry cough for about three days, worse at night when lying flat. Denies any preceding illness/cold. Denies an injury- works at [employer] has to do lifting, but nothing really heavy. Recalls using a massage chair on Sunday before onset- this is the only possible injury to back. Tried Aleeve and Tylenol without relief.   HPI  Past Medical History:  Diagnosis Date  . Atypical chest pain   . Type II or unspecified type diabetes mellitus without mention of complication, not stated as uncontrolled     Patient Active Problem List   Diagnosis Date Noted  . DIABETES MELLITUS, TYPE II 12/16/2010  . PNEUMONIA 12/15/2010  . DYSPNEA 11/21/2010  . CHEST PAIN 11/21/2010    History reviewed. No pertinent surgical history.     Home Medications    Prior to Admission medications   Medication Sig Start Date End Date Taking? Authorizing Provider  acetaminophen (TYLENOL) 325 MG tablet Take 650 mg by mouth every 6 (six) hours as needed for pain (headache).    Yes [provider]  amoxicillin (AMOXIL) 500 MG capsule Take 1 capsule (500 mg total) by mouth 3 (three) times daily. 12/13/15   Lance Bosch, NP  atorvastatin (LIPITOR) 40 MG tablet Take 1 tablet (40 mg total) by mouth daily. 12/18/15   Lance Bosch, NP  cetirizine (ZYRTEC) 10 MG tablet Take 10 mg by mouth daily as needed for allergies.    [provider]  cyclobenzaprine (FLEXERIL) 10 MG tablet Take 1 tablet (10 mg total) by mouth  at bedtime for 7 days. 10/27/17 11/03/17  Drezden Seitzinger, Elesa Hacker, PA-C  GlucoCom Lancets MISC Check blood sugar TID & QHS 10/12/14   Chari Manning A, NP  glucose blood (CHOICE DM FORA G20 TEST STRIPS) test strip Use as instructed 10/12/14   Chari Manning A, NP  glucose monitoring kit (FREESTYLE) monitoring kit 1 each by Does not apply route as needed for other. Check blood sugar TID & QHS 10/12/14   Lance Bosch, NP  lisinopril (PRINIVIL,ZESTRIL) 2.5 MG tablet Take 1 tablet (2.5 mg total) by mouth daily. 12/13/15   Lance Bosch, NP  meclizine (ANTIVERT) 25 MG tablet Take 1 tablet (25 mg total) by mouth 3 (three) times daily as needed for dizziness. Patient not taking: Reported on 10/11/2014 08/06/13   Cleatrice Burke, PA-C  meloxicam (MOBIC) 15 MG tablet Take 1 tablet (15 mg total) by mouth daily. 12/13/15   Lance Bosch, NP  metFORMIN (GLUCOPHAGE) 500 MG tablet Take 1 tablet (500 mg total) by mouth 2 (two) times daily with a meal. 12/13/15   Lance Bosch, NP    Family History Family History  Problem Relation Age of Onset  . Asthma Mother   . Diabetes Father   . Allergies Son   . Asthma Son     Social History Social History   Tobacco Use  . Smoking status: Never Smoker  . Smokeless  tobacco: Never Used  Substance Use Topics  . Alcohol use: Yes    Comment: "socially"  . Drug use: No     Allergies   Patient has no known allergies.   Review of Systems Review of Systems  Constitutional: Negative for fever.  HENT: Negative for congestion, rhinorrhea and sore throat.   Respiratory: Positive for cough. Negative for shortness of breath.   Cardiovascular: Negative for chest pain and leg swelling.  Gastrointestinal: Negative for abdominal pain, nausea and vomiting.  Genitourinary: Negative for dysuria and frequency.  Musculoskeletal: Positive for back pain and myalgias. Negative for neck pain and neck stiffness.  Skin: Negative for rash.  Neurological: Negative for dizziness,  light-headedness and headaches.     Physical Exam Triage Vital Signs ED Triage Vitals  Enc Vitals Group     BP 10/27/17 1121 121/76     Pulse Rate 10/27/17 1121 (!) 56     Resp 10/27/17 1121 16     Temp 10/27/17 1121 98.4 F (36.9 C)     Temp Source 10/27/17 1121 Oral     SpO2 10/27/17 1121 97 %     Weight --      Height --      Head Circumference --      Peak Flow --      Pain Score 10/27/17 1119 8     Pain Loc --      Pain Edu? --      Excl. in [place]? --    No data found.  Updated Vital Signs BP 121/76 (BP Location: Right Arm)   Pulse (!) 56   Temp 98.4 F (36.9 C) (Oral)   Resp 16   SpO2 97%    Physical Exam  Constitutional: He is oriented to person, place, and time. He appears well-developed and well-nourished.  HENT:  Head: Normocephalic and atraumatic.  Mouth/Throat: Oropharynx is clear and moist.  Eyes: Conjunctivae and EOM are normal. Pupils are equal, round, and reactive to light.  Neck: Neck supple.  Cardiovascular: Normal rate and regular rhythm.  No murmur heard. Pulmonary/Chest: Effort normal and breath sounds normal. No respiratory distress. He has no wheezes.  Moving air well throughout lung fields bilaterally.   Abdominal: Soft. There is no tenderness.  Musculoskeletal: He exhibits no edema.  NO tenderness to palpation of cervical, thoracic or lumbar spine. Mild tenderness to palpation of thoracic muscles and paraspinal muscles. Moderate tenderness to left lower ribs.   Neurological: He is alert and oriented to person, place, and time. No cranial nerve deficit.  Skin: Skin is warm and dry.  Psychiatric: He has a normal mood and affect.  Nursing note and vitals reviewed.   UC Treatments / Results  Labs (all labs ordered are listed, but only abnormal results are displayed) Labs Reviewed - No data to display  EKG  EKG Interpretation None       Radiology Dg Chest 2 View  Result Date: 10/27/2017 CLINICAL DATA:  Back pain, cough. EXAM:  CHEST  2 VIEW COMPARISON:  Radiograph of April 20, 2013. FINDINGS: The heart size and mediastinal contours are within normal limits. Both lungs are clear. No pneumothorax or pleural effusion is noted. The visualized skeletal structures are unremarkable. IMPRESSION: No active cardiopulmonary disease. Electronically Signed   By: Marijo Conception, M.D.   On: 10/27/2017 12:35    Procedures Procedures (including critical care time)  Medications Ordered in UC Medications - No data to display   Initial Impression / Assessment and  Plan / UC Course  I have reviewed the triage vital signs and the nursing notes.  Pertinent labs & imaging results that were available during my care of the patient were reviewed by me and considered in my medical decision making (see chart for details).    Pain reproducible on exam. CXR clear of intrapulmonary pathology. No urinary symptoms. Will treat was muscular strain/spasm with anti-inflammatories during the day, flexeril in the evening. Ice/heat. Advised may take 1-2 weeks for improvement. Recommended against bed rest, but avoid any aggravating movements, heavy lifting.   Final Clinical Impressions(s) / UC Diagnoses   Final diagnoses:  Muscle spasm of back    ED Discharge Orders        Ordered    cyclobenzaprine (FLEXERIL) 10 MG tablet  Daily at bedtime     10/27/17 1248       Controlled Substance Prescriptions [place] Controlled Substance Registry consulted? Not Applicable   Janith Lima, [place] 10/27/17 1326

## 2017-10-27 NOTE — ED Triage Notes (Signed)
PT C/O: back pain that increases w/activity and deep breaths and lying down... Also reports dry cough   ONSET: 4 days   DENIES: fevers, chills  TAKING MEDS: Naproxen and acetaminophen and lidocaine patches   A&O x4... NAD... Ambulatory

## 2017-10-27 NOTE — Discharge Instructions (Signed)
Pain is most likely a muscular strain. Pain may take 1-2 weeks to resolve.  Use anti-inflammatories for pain/swelling. You may take up to 800 mg Ibuprofen every 8 hours with food. You may supplement Ibuprofen with Tylenol [phone] mg every 8 hours.   Please alternate using ice/heating pad to help with any discomfort.   Please follow up here or with your primary care if symptoms not improving in 2 weeks. Please return sooner if pain changes, worsens, develop numbness, tingling. Develop fever.

## 2018-05-26 ENCOUNTER — Inpatient Hospital Stay (HOSPITAL_COMMUNITY)
Admission: EM | Admit: 2018-05-26 | Discharge: 2018-05-30 | DRG: 983 | Disposition: A | Discharge status: Home Health | Payer: No Typology Code available for payment source | Attending: Orthopedic Surgery | Admitting: Orthopedic Surgery

## 2018-05-26 ENCOUNTER — Emergency Department (HOSPITAL_COMMUNITY): Payer: No Typology Code available for payment source

## 2018-05-26 ENCOUNTER — Encounter (HOSPITAL_COMMUNITY): Payer: Self-pay

## 2018-05-26 ENCOUNTER — Other Ambulatory Visit: Payer: Self-pay

## 2018-05-26 DIAGNOSIS — Z833 Family history of diabetes mellitus: Secondary | ICD-10-CM

## 2018-05-26 DIAGNOSIS — Z79899 Other long term (current) drug therapy: Secondary | ICD-10-CM

## 2018-05-26 DIAGNOSIS — T79A12A Traumatic compartment syndrome of left upper extremity, initial encounter: Secondary | ICD-10-CM | POA: Diagnosis not present

## 2018-05-26 DIAGNOSIS — R0789 Other chest pain: Secondary | ICD-10-CM | POA: Diagnosis present

## 2018-05-26 DIAGNOSIS — Z7984 Long term (current) use of oral hypoglycemic drugs: Secondary | ICD-10-CM

## 2018-05-26 DIAGNOSIS — W269XXA Contact with unspecified sharp object(s), initial encounter: Secondary | ICD-10-CM

## 2018-05-26 DIAGNOSIS — E119 Type 2 diabetes mellitus without complications: Secondary | ICD-10-CM | POA: Diagnosis present

## 2018-05-26 LAB — BASIC METABOLIC PANEL
Anion gap: 9 (ref 5–15)
BUN: 16 mg/dL (ref 6–20)
CO2: 23 mmol/L (ref 22–32)
Calcium: 9 mg/dL (ref 8.9–10.3)
Chloride: 108 mmol/L (ref 98–111)
Creatinine, Ser: 0.93 mg/dL (ref 0.61–1.24)
GFR calc Af Amer: >60 mL/min (ref >60)
GFR calc non Af Amer: >60 mL/min (ref >60)
Glucose, Bld: 184 mg/dL — ABNORMAL HIGH (ref 70–99)
Potassium: 3.8 mmol/L (ref 3.5–5.1)
Sodium: 140 mmol/L (ref 135–145)

## 2018-05-26 LAB — CBC
HCT: 42.6 % (ref 39.0–52.0)
Hemoglobin: 14.7 g/dL (ref 13.0–17.0)
MCH: 30.1 pg (ref 26.0–34.0)
MCHC: 34.5 g/dL (ref 30.0–36.0)
MCV: 87.3 fL (ref 78.0–100.0)
Platelets: 221 10*3/uL (ref 150–400)
RBC: 4.88 MIL/uL (ref 4.22–5.81)
RDW: 12.3 % (ref 11.5–15.5)
WBC: 11.8 10*3/uL — ABNORMAL HIGH (ref 4.0–10.5)

## 2018-05-26 LAB — I-STAT TROPONIN, ED: Troponin i, poc: 0 ng/mL (ref 0.00–0.08)

## 2018-05-26 MED ORDER — DIAZEPAM 5 MG PO TABS
5.0000 mg | ORAL_TABLET | Freq: Once | ORAL | Status: AC
  Administered 2018-05-26: 5 mg via ORAL
  Filled 2018-05-26: qty 1

## 2018-05-26 MED ORDER — HYDROMORPHONE HCL 2 MG/ML IJ SOLN
INTRAMUSCULAR | Status: AC
  Filled 2018-05-26: qty 1

## 2018-05-26 MED ORDER — HYDROMORPHONE HCL 1 MG/ML IJ SOLN
1.0000 mg | Freq: Once | INTRAMUSCULAR | Status: AC
  Administered 2018-05-26: 1 mg via INTRAVENOUS
  Filled 2018-05-26: qty 1

## 2018-05-26 MED ORDER — SUFENTANIL CITRATE 50 MCG/ML IV SOLN
INTRAVENOUS | Status: AC
  Filled 2018-05-26: qty 1

## 2018-05-26 MED ORDER — ONDANSETRON HCL 4 MG/2ML IJ SOLN
INTRAMUSCULAR | Status: AC
  Filled 2018-05-26: qty 2

## 2018-05-26 MED ORDER — LIDOCAINE 2% (20 MG/ML) 5 ML SYRINGE
INTRAMUSCULAR | Status: AC
  Filled 2018-05-26: qty 5

## 2018-05-26 MED ORDER — DEXAMETHASONE SODIUM PHOSPHATE 10 MG/ML IJ SOLN
INTRAMUSCULAR | Status: AC
  Filled 2018-05-26: qty 1

## 2018-05-26 MED ORDER — SUCCINYLCHOLINE CHLORIDE 200 MG/10ML IV SOSY
PREFILLED_SYRINGE | INTRAVENOUS | Status: AC
  Filled 2018-05-26: qty 10

## 2018-05-26 MED ORDER — PROPOFOL 10 MG/ML IV BOLUS
INTRAVENOUS | Status: AC
  Filled 2018-05-26: qty 20

## 2018-05-26 MED ORDER — MIDAZOLAM HCL 2 MG/2ML IJ SOLN
INTRAMUSCULAR | Status: AC
  Filled 2018-05-26: qty 2

## 2018-05-26 NOTE — ED Notes (Signed)
Patient transported to X-ray 

## 2018-05-26 NOTE — ED Notes (Signed)
EKG given to EDP,Wentz,MD. For review. 

## 2018-05-26 NOTE — ED Triage Notes (Signed)
Pt is alert and oriented x 4 and is verbally responsive. Pt reports that he has had left sided chest pain since 8 am pt reports associated nausea and vomiting 3 episodes . Pt is also reporting left side arm pain and numbness and was seen at urgent care to today for Lac to left arm in which he cut with a box cutter accidental and had stiches placed today.

## 2018-05-27 ENCOUNTER — Emergency Department (HOSPITAL_COMMUNITY): Payer: No Typology Code available for payment source | Admitting: Registered Nurse

## 2018-05-27 ENCOUNTER — Encounter (HOSPITAL_COMMUNITY): Payer: Self-pay | Admitting: Registered Nurse

## 2018-05-27 ENCOUNTER — Encounter (HOSPITAL_COMMUNITY)
Admission: EM | Disposition: A | Discharge status: Home Health | Payer: Self-pay | Source: Home / Self Care | Attending: Orthopedic Surgery

## 2018-05-27 DIAGNOSIS — Z7984 Long term (current) use of oral hypoglycemic drugs: Secondary | ICD-10-CM | POA: Diagnosis not present

## 2018-05-27 DIAGNOSIS — Z79899 Other long term (current) drug therapy: Secondary | ICD-10-CM | POA: Diagnosis not present

## 2018-05-27 DIAGNOSIS — W269XXA Contact with unspecified sharp object(s), initial encounter: Secondary | ICD-10-CM | POA: Diagnosis not present

## 2018-05-27 DIAGNOSIS — R0789 Other chest pain: Secondary | ICD-10-CM | POA: Diagnosis present

## 2018-05-27 DIAGNOSIS — T79A12A Traumatic compartment syndrome of left upper extremity, initial encounter: Secondary | ICD-10-CM | POA: Diagnosis present

## 2018-05-27 DIAGNOSIS — Z833 Family history of diabetes mellitus: Secondary | ICD-10-CM | POA: Diagnosis not present

## 2018-05-27 DIAGNOSIS — E119 Type 2 diabetes mellitus without complications: Secondary | ICD-10-CM | POA: Diagnosis present

## 2018-05-27 HISTORY — PX: FASCIOTOMY: SHX132

## 2018-05-27 HISTORY — PX: APPLICATION OF WOUND VAC: SHX5189

## 2018-05-27 LAB — HIV ANTIBODY (ROUTINE TESTING W REFLEX): HIV Screen 4th Generation wRfx: NONREACTIVE

## 2018-05-27 LAB — GLUCOSE, CAPILLARY
Glucose-Capillary: 148 mg/dL — ABNORMAL HIGH (ref 70–99)
Glucose-Capillary: 109 mg/dL — ABNORMAL HIGH (ref 70–99)
Glucose-Capillary: 103 mg/dL — ABNORMAL HIGH (ref 70–99)
Glucose-Capillary: 111 mg/dL — ABNORMAL HIGH (ref 70–99)

## 2018-05-27 SURGERY — FASCIOTOMY, UPPER EXTREMITY
Anesthesia: General | Site: Arm Lower | Laterality: Left

## 2018-05-27 MED ORDER — ONDANSETRON HCL 4 MG/2ML IJ SOLN: 4.0000 mg | Freq: Four times a day (QID) | INTRAMUSCULAR | Status: DC | PRN

## 2018-05-27 MED ORDER — BISACODYL 10 MG RE SUPP: 10.0000 mg | Freq: Every day | RECTAL | Status: DC | PRN

## 2018-05-27 MED ORDER — MEPERIDINE HCL 50 MG/ML IJ SOLN: 6.2500 mg | INTRAMUSCULAR | Status: DC | PRN

## 2018-05-27 MED ORDER — SENNA-DOCUSATE SODIUM 8.6-50 MG PO TABS: 2.0000 | ORAL_TABLET | Freq: Every day | ORAL | 1 refills | Status: DC

## 2018-05-27 MED ORDER — HYDROMORPHONE HCL 1 MG/ML IJ SOLN
INTRAMUSCULAR | Status: AC
  Filled 2018-05-27: qty 1

## 2018-05-27 MED ORDER — SUFENTANIL CITRATE 50 MCG/ML IV SOLN
INTRAVENOUS | Status: DC | PRN
  Administered 2018-05-27: 5 ug via INTRAVENOUS
  Administered 2018-05-27: 15 ug via INTRAVENOUS
  Administered 2018-05-27: 10 ug via INTRAVENOUS

## 2018-05-27 MED ORDER — CEFAZOLIN SODIUM-DEXTROSE 1-4 GM/50ML-% IV SOLN
1.0000 g | Freq: Three times a day (TID) | INTRAVENOUS | Status: DC
  Administered 2018-05-27 – 2018-05-30 (×12): 1 g via INTRAVENOUS
  Filled 2018-05-27 (×11): qty 50

## 2018-05-27 MED ORDER — INSULIN ASPART 100 UNIT/ML ~~LOC~~ SOLN
0.0000 [IU] | Freq: Three times a day (TID) | SUBCUTANEOUS | Status: DC
  Administered 2018-05-28: 3 [IU] via SUBCUTANEOUS
  Administered 2018-05-29: 2 [IU] via SUBCUTANEOUS
  Administered 2018-05-29: 3 [IU] via SUBCUTANEOUS
  Administered 2018-05-29: 2 [IU] via SUBCUTANEOUS

## 2018-05-27 MED ORDER — LIDOCAINE 2% (20 MG/ML) 5 ML SYRINGE
INTRAMUSCULAR | Status: DC | PRN
  Administered 2018-05-27: 50 mg via INTRAVENOUS
  Administered 2018-05-27: 75 mg via INTRAVENOUS

## 2018-05-27 MED ORDER — CEFAZOLIN SODIUM-DEXTROSE 2-4 GM/100ML-% IV SOLN
INTRAVENOUS | Status: AC
  Filled 2018-05-27: qty 100

## 2018-05-27 MED ORDER — ACETAMINOPHEN 10 MG/ML IV SOLN
INTRAVENOUS | Status: DC | PRN
  Administered 2018-05-27: 1000 mg via INTRAVENOUS

## 2018-05-27 MED ORDER — HYDROMORPHONE HCL 1 MG/ML IJ SOLN: 0.5000 mg | INTRAMUSCULAR | Status: DC | PRN

## 2018-05-27 MED ORDER — HYDROCODONE-ACETAMINOPHEN 10-325 MG PO TABS: 1.0000 | ORAL_TABLET | Freq: Four times a day (QID) | ORAL | 0 refills | Status: DC | PRN

## 2018-05-27 MED ORDER — DIPHENHYDRAMINE HCL 25 MG PO CAPS: 25.0000 mg | ORAL_CAPSULE | Freq: Four times a day (QID) | ORAL | Status: DC | PRN

## 2018-05-27 MED ORDER — ZOLPIDEM TARTRATE 5 MG PO TABS
5.0000 mg | ORAL_TABLET | Freq: Every evening | ORAL | Status: DC | PRN
Start: 2018-05-27 — End: 2018-05-30

## 2018-05-27 MED ORDER — METFORMIN HCL 500 MG PO TABS
500.0000 mg | ORAL_TABLET | Freq: Two times a day (BID) | ORAL | Status: DC
  Administered 2018-05-27 – 2018-05-30 (×6): 500 mg via ORAL
  Filled 2018-05-27 (×6): qty 1

## 2018-05-27 MED ORDER — HYDROMORPHONE HCL 1 MG/ML IJ SOLN: 0.2500 mg | INTRAMUSCULAR | Status: DC | PRN

## 2018-05-27 MED ORDER — ONDANSETRON HCL 4 MG/2ML IJ SOLN
INTRAMUSCULAR | Status: DC | PRN
  Administered 2018-05-27: 4 mg via INTRAVENOUS

## 2018-05-27 MED ORDER — SODIUM CHLORIDE 0.9 % IR SOLN
Status: DC | PRN
  Administered 2018-05-27: 3000 mL

## 2018-05-27 MED ORDER — MIDAZOLAM HCL 5 MG/5ML IJ SOLN
INTRAMUSCULAR | Status: DC | PRN
  Administered 2018-05-27: 2 mg via INTRAVENOUS

## 2018-05-27 MED ORDER — METHOCARBAMOL 1000 MG/10ML IJ SOLN
500.0000 mg | Freq: Four times a day (QID) | INTRAVENOUS | Status: DC | PRN
  Administered 2018-05-27: 500 mg via INTRAVENOUS
  Filled 2018-05-27: qty 5
  Filled 2018-05-27: qty 550

## 2018-05-27 MED ORDER — PROPOFOL 10 MG/ML IV BOLUS
INTRAVENOUS | Status: DC | PRN
  Administered 2018-05-27: 200 mg via INTRAVENOUS

## 2018-05-27 MED ORDER — CEPHALEXIN 500 MG PO CAPS: 500.0000 mg | ORAL_CAPSULE | Freq: Four times a day (QID) | ORAL | 0 refills | Status: DC

## 2018-05-27 MED ORDER — LORATADINE 10 MG PO TABS
10.0000 mg | ORAL_TABLET | Freq: Every day | ORAL | Status: DC
  Administered 2018-05-27 – 2018-05-30 (×3): 10 mg via ORAL
  Filled 2018-05-27 (×3): qty 1

## 2018-05-27 MED ORDER — LACTATED RINGERS IV SOLN
INTRAVENOUS | Status: DC | PRN
  Administered 2018-05-27 (×2): via INTRAVENOUS

## 2018-05-27 MED ORDER — ALPRAZOLAM 0.5 MG PO TABS: 0.5000 mg | ORAL_TABLET | Freq: Four times a day (QID) | ORAL | Status: DC | PRN

## 2018-05-27 MED ORDER — ATORVASTATIN CALCIUM 40 MG PO TABS: 40.0000 mg | ORAL_TABLET | Freq: Every day | ORAL | Status: DC

## 2018-05-27 MED ORDER — SENNA 8.6 MG PO TABS
1.0000 | ORAL_TABLET | Freq: Two times a day (BID) | ORAL | Status: DC
  Administered 2018-05-27 – 2018-05-30 (×6): 8.6 mg via ORAL
  Filled 2018-05-27 (×6): qty 1

## 2018-05-27 MED ORDER — PANTOPRAZOLE SODIUM 40 MG PO TBEC: 40.0000 mg | DELAYED_RELEASE_TABLET | Freq: Two times a day (BID) | ORAL | Status: DC | PRN

## 2018-05-27 MED ORDER — CEFAZOLIN SODIUM-DEXTROSE 2-3 GM-%(50ML) IV SOLR
2.0000 g | Freq: Once | INTRAVENOUS | Status: AC
  Administered 2018-05-27: 2 g via INTRAVENOUS

## 2018-05-27 MED ORDER — VITAMIN C 500 MG PO TABS
1000.0000 mg | ORAL_TABLET | Freq: Every day | ORAL | Status: DC
  Administered 2018-05-28 – 2018-05-30 (×2): 1000 mg via ORAL
  Filled 2018-05-27 (×2): qty 2

## 2018-05-27 MED ORDER — SUCCINYLCHOLINE CHLORIDE 200 MG/10ML IV SOSY
PREFILLED_SYRINGE | INTRAVENOUS | Status: DC | PRN
  Administered 2018-05-27: 140 mg via INTRAVENOUS

## 2018-05-27 MED ORDER — METHOCARBAMOL 500 MG PO TABS: 500.0000 mg | ORAL_TABLET | Freq: Four times a day (QID) | ORAL | Status: DC | PRN

## 2018-05-27 MED ORDER — POLYETHYLENE GLYCOL 3350 17 G PO PACK: 17.0000 g | PACK | Freq: Every day | ORAL | Status: DC | PRN

## 2018-05-27 MED ORDER — ONDANSETRON HCL 4 MG PO TABS: 4.0000 mg | ORAL_TABLET | Freq: Four times a day (QID) | ORAL | Status: DC | PRN

## 2018-05-27 MED ORDER — POTASSIUM CHLORIDE IN NACL 20-0.45 MEQ/L-% IV SOLN
INTRAVENOUS | Status: DC
  Administered 2018-05-27 – 2018-05-28 (×2): via INTRAVENOUS
  Administered 2018-05-29: 1 mL via INTRAVENOUS
  Administered 2018-05-29: 20:00:00 via INTRAVENOUS
  Filled 2018-05-27 (×5): qty 1000

## 2018-05-27 MED ORDER — ACETAMINOPHEN 10 MG/ML IV SOLN
INTRAVENOUS | Status: AC
  Filled 2018-05-27: qty 100

## 2018-05-27 MED ORDER — DOCUSATE SODIUM 100 MG PO CAPS
100.0000 mg | ORAL_CAPSULE | Freq: Two times a day (BID) | ORAL | Status: DC
  Administered 2018-05-27 – 2018-05-30 (×6): 100 mg via ORAL
  Filled 2018-05-27 (×6): qty 1

## 2018-05-27 MED ORDER — ONDANSETRON HCL 4 MG/2ML IJ SOLN: 4.0000 mg | Freq: Once | INTRAMUSCULAR | Status: DC | PRN

## 2018-05-27 MED ORDER — MAGNESIUM CITRATE PO SOLN: 1.0000 | Freq: Once | ORAL | Status: DC | PRN

## 2018-05-27 MED ORDER — LISINOPRIL 5 MG PO TABS
2.5000 mg | ORAL_TABLET | Freq: Every day | ORAL | Status: DC
  Administered 2018-05-27 – 2018-05-30 (×3): 2.5 mg via ORAL
  Filled 2018-05-27 (×3): qty 1

## 2018-05-27 MED ORDER — OXYCODONE HCL 5 MG PO TABS: 5.0000 mg | ORAL_TABLET | ORAL | Status: DC | PRN

## 2018-05-27 MED ORDER — HYDROCODONE-ACETAMINOPHEN 5-325 MG PO TABS
1.0000 | ORAL_TABLET | ORAL | Status: DC | PRN
  Administered 2018-05-27 (×2): 2 via ORAL
  Administered 2018-05-28: 1 via ORAL
  Administered 2018-05-28 – 2018-05-29 (×5): 2 via ORAL
  Filled 2018-05-27 (×8): qty 2

## 2018-05-27 SURGICAL SUPPLY — 41 items
BANDAGE ACE 4X5 VEL STRL LF (GAUZE/BANDAGES/DRESSINGS) ×9 IMPLANT
BANDAGE ACE 6X5 VEL STRL LF (GAUZE/BANDAGES/DRESSINGS) IMPLANT
BNDG COHESIVE 4X5 TAN STRL (GAUZE/BANDAGES/DRESSINGS) ×3 IMPLANT
BNDG GAUZE ELAST 4 BULKY (GAUZE/BANDAGES/DRESSINGS) IMPLANT
CORD BIPOLAR FORCEPS 12FT (ELECTRODE) ×3 IMPLANT
COVER SURGICAL LIGHT HANDLE (MISCELLANEOUS) ×3 IMPLANT
CUFF TOURN SGL QUICK 18 (TOURNIQUET CUFF) ×3 IMPLANT
CUFF TOURN SGL QUICK 24 (TOURNIQUET CUFF)
CUFF TOURN SGL QUICK 34 (TOURNIQUET CUFF)
CUFF TRNQT CYL 24X4X40X1 (TOURNIQUET CUFF) IMPLANT
CUFF TRNQT CYL 34X4X40X1 (TOURNIQUET CUFF) IMPLANT
DRAPE INCISE IOBAN 66X45 STRL (DRAPES) ×3 IMPLANT
DRAPE ORTHO SPLIT 77X108 STRL (DRAPES)
DRAPE SURG 17X11 SM STRL (DRAPES) ×3 IMPLANT
DRAPE SURG ORHT 6 SPLT 77X108 (DRAPES) IMPLANT
DRAPE U-SHAPE 47X51 STRL (DRAPES) IMPLANT
DRSG ADAPTIC 3X8 NADH LF (GAUZE/BANDAGES/DRESSINGS) ×3 IMPLANT
DRSG PAD ABDOMINAL 8X10 ST (GAUZE/BANDAGES/DRESSINGS) IMPLANT
DRSG VAC ATS SM SENSATRAC (GAUZE/BANDAGES/DRESSINGS) ×3 IMPLANT
DURAPREP 26ML APPLICATOR (WOUND CARE) IMPLANT
ELECT REM PT RETURN 15FT ADLT (MISCELLANEOUS) ×3 IMPLANT
GAUZE SPONGE 4X4 12PLY STRL (GAUZE/BANDAGES/DRESSINGS) IMPLANT
GLOVE BIOGEL PI IND STRL 9 (GLOVE) ×2 IMPLANT
GLOVE BIOGEL PI INDICATOR 9 (GLOVE) ×1
GLOVE SURG ORTHO 9.0 STRL STRW (GLOVE) ×3 IMPLANT
GOWN STRL REUS W/TWL 2XL LVL3 (GOWN DISPOSABLE) ×3 IMPLANT
GOWN STRL REUS W/TWL LRG LVL3 (GOWN DISPOSABLE) ×3 IMPLANT
KIT BASIN OR (CUSTOM PROCEDURE TRAY) ×3 IMPLANT
KIT TURNOVER KIT A (KITS) ×3 IMPLANT
MANIFOLD NEPTUNE II (INSTRUMENTS) ×3 IMPLANT
NS IRRIG 1000ML POUR BTL (IV SOLUTION) ×3 IMPLANT
PACK GENERAL/GYN (CUSTOM PROCEDURE TRAY) IMPLANT
PACK ORTHO EXTREMITY (CUSTOM PROCEDURE TRAY) ×3 IMPLANT
SET CYSTO W/LG BORE CLAMP LF (SET/KITS/TRAYS/PACK) ×3 IMPLANT
SET MONITOR QUICK PRESSURE (MISCELLANEOUS) IMPLANT
SPLINT FIBERGLASS 4X30 (CAST SUPPLIES) ×3 IMPLANT
SPONGE LAP 18X18 RF (DISPOSABLE) IMPLANT
STAPLER VISISTAT 35W (STAPLE) IMPLANT
STOCKINETTE 8 INCH (MISCELLANEOUS) IMPLANT
SUT ETHILON 3 0 PS 1 (SUTURE) ×3 IMPLANT
TOWEL OR 17X26 10 PK STRL BLUE (TOWEL DISPOSABLE) ×3 IMPLANT

## 2018-05-27 NOTE — ED Provider Notes (Signed)
[facility] DEPT Provider Note   CSN: [id number] Arrival date & time: 05/26/18  2149     History   Chief Complaint Chief Complaint  Patient presents with  . Chest Pain    HPI Francisco Smith is a 47 y.o. male.  HPI Patient is a 47 year old male who underwent a box cutter injury stab wound to the left anterior mid forearm approximately 11 hours ago.  He was seen in urgent care and reports there was a significant amount of bleeding at that time.  He states the bleeding was "gushing".  He was sutured and discharged home after treatment with tetanus.  He states he went home and to a nap and when he awoke he was having severe pain in his left forearm and down into his left hand with paresthesias of the left hand.  His wife states that his left forearm had significantly swollen.  He is now presenting with 10 out of 10 pain in his left forearm with radiation into his left hand and shooting up his left arm and into his left chest.  He states he is unable to move his fingers without severe pain.  He continues to have paresthesias.  No prior history of cardiac disease.  He does report pain radiating up into his chest.  He is intermittently yelling out in pain.  No fevers or chills.  No redness.  No use of anticoagulants.  He does have a history of diabetes.   Past Medical History:  Diagnosis Date  . Atypical chest pain   . Type II or unspecified type diabetes mellitus without mention of complication, not stated as uncontrolled     Patient Active Problem List   Diagnosis Date Noted  . DIABETES MELLITUS, TYPE II 12/16/2010  . PNEUMONIA 12/15/2010  . DYSPNEA 11/21/2010  . CHEST PAIN 11/21/2010    History reviewed. No pertinent surgical history.      Home Medications    Prior to Admission medications   Medication Sig Start Date End Date Taking? Authorizing Provider  acetaminophen (TYLENOL) 325 MG tablet Take 650 mg by mouth every 6 (six) hours as needed  for pain (headache).     [provider]  amoxicillin (AMOXIL) 500 MG capsule Take 1 capsule (500 mg total) by mouth 3 (three) times daily. 12/13/15   Lance Bosch, NP  atorvastatin (LIPITOR) 40 MG tablet Take 1 tablet (40 mg total) by mouth daily. 12/18/15   Lance Bosch, NP  cetirizine (ZYRTEC) 10 MG tablet Take 10 mg by mouth daily as needed for allergies.    [provider]  GlucoCom Lancets MISC Check blood sugar TID & QHS 10/12/14   Lance Bosch, NP  glucose blood (CHOICE DM FORA G20 TEST STRIPS) test strip Use as instructed 10/12/14   Chari Manning A, NP  glucose monitoring kit (FREESTYLE) monitoring kit 1 each by Does not apply route as needed for other. Check blood sugar TID & QHS 10/12/14   Lance Bosch, NP  lisinopril (PRINIVIL,ZESTRIL) 2.5 MG tablet Take 1 tablet (2.5 mg total) by mouth daily. 12/13/15   Lance Bosch, NP  meclizine (ANTIVERT) 25 MG tablet Take 1 tablet (25 mg total) by mouth 3 (three) times daily as needed for dizziness. Patient not taking: Reported on 10/11/2014 08/06/13   Cleatrice Burke, PA-C  meloxicam (MOBIC) 15 MG tablet Take 1 tablet (15 mg total) by mouth daily. 12/13/15   Lance Bosch, NP  metFORMIN (GLUCOPHAGE) 500  MG tablet Take 1 tablet (500 mg total) by mouth 2 (two) times daily with a meal. 12/13/15   Lance Bosch, NP    Family History Family History  Problem Relation Age of Onset  . Asthma Mother   . Diabetes Father   . Allergies Son   . Asthma Son     Social History Social History   Tobacco Use  . Smoking status: Never Smoker  . Smokeless tobacco: Never Used  Substance Use Topics  . Alcohol use: Yes    Comment: "socially"  . Drug use: No     Allergies   Patient has no known allergies.   Review of Systems Review of Systems  All other systems reviewed and are negative.    Physical Exam Updated Vital Signs BP (!) 152/81   Pulse 66   Temp 98.8 F (37.1 C) (Oral)   Resp 17   Ht _0  (1.727  m)   Wt 83.9 kg (185 lb)   SpO2 100%   BMI 28.13 kg/m   Physical Exam  Constitutional: He is oriented to person, place, and time. He appears well-developed and well-nourished.  HENT:  Head: Normocephalic and atraumatic.  Eyes: EOM are normal.  Neck: Normal range of motion.  Cardiovascular: Normal rate, regular rhythm and normal heart sounds.  Pulmonary/Chest: Effort normal and breath sounds normal. No respiratory distress.  Abdominal: Soft. He exhibits no distension. There is no tenderness.  Musculoskeletal: Normal range of motion.  Flexed fingers of the left hand.  Normal left radial and ulnar pulses.  Full compartments of the left anterior forearm.  Able to range the left elbow.  Unable to move his fingers or his wrist without severe pain with both active and passive range of motion.  Neurological: He is alert and oriented to person, place, and time.  Skin: Skin is warm and dry.  Psychiatric: He has a normal mood and affect. Judgment normal.  Nursing note and vitals reviewed.    ED Treatments / Results  Labs (all labs ordered are listed, but only abnormal results are displayed) Labs Reviewed  BASIC METABOLIC PANEL - Abnormal; Notable for the following components:      Result Value   Glucose, Bld 184 (*)    All other components within normal limits  CBC - Abnormal; Notable for the following components:   WBC 11.8 (*)    All other components within normal limits  I-STAT TROPONIN, ED    EKG EKG Interpretation  Date/Time:  Thursday May 26 2018 22:01:13 EDT Ventricular Rate:  69 PR Interval:    QRS Duration: 99 QT Interval:  373 QTC Calculation: 400 R Axis:   71 Text Interpretation:  Sinus rhythm ST elev, probable normal early repol pattern since last tracing no significant change Confirmed by Daleen Bo [phone]) on 05/26/2018 11:10:15 PM   Radiology Dg Chest 2 View  Result Date: 05/26/2018 CLINICAL DATA:  Left-sided chest pain, nausea, and vomiting since 8 a.m.  Left arm pain and numbness. EXAM: CHEST - 2 VIEW COMPARISON:  10/27/2017 FINDINGS: Slightly shallow inspiration. Normal heart size and pulmonary vascularity. No focal airspace disease or consolidation in the lungs. No blunting of costophrenic angles. No pneumothorax. Mediastinal contours appear intact. IMPRESSION: No active cardiopulmonary disease. Electronically Signed   By: Lucienne Capers M.D.   On: 05/26/2018 22:36    Procedures .Critical Care Performed by: Jola Schmidt, MD Authorized by: Jola Schmidt, MD     CRITICAL CARE Performed by: Jola Schmidt Total critical  care time: 31 minutes Critical care time was exclusive of separately billable procedures and treating other patients. Critical care was necessary to treat or prevent imminent or life-threatening deterioration. Critical care was time spent personally by me on the following activities: development of treatment plan with patient and/or surrogate as well as nursing, discussions with consultants, evaluation of patient's response to treatment, examination of patient, obtaining history from patient or surrogate, ordering and performing treatments and interventions, ordering and review of laboratory studies, ordering and review of radiographic studies, pulse oximetry and re-evaluation of patient's condition.   Medications Ordered in ED Medications  acetaminophen (OFIRMEV) 10 MG/ML IV (has no administration in time range)  ceFAZolin (ANCEF) 2-4 GM/100ML-% IVPB (has no administration in time range)  HYDROmorphone (DILAUDID) injection 1 mg (1 mg Intravenous Given 05/26/18 2349)  diazepam (VALIUM) tablet 5 mg (5 mg Oral Given 05/26/18 2349)     Initial Impression / Assessment and Plan / ED Course  I have reviewed the triage vital signs and the nursing notes.  Pertinent labs & imaging results that were available during my care of the patient were reviewed by me and considered in my medical decision making (see chart for details).      At time my evaluation the patient has a left forearm concerning for acute compartment syndrome given the paresthesias pain out of proportion and tight left forearm compartment.  Left arm immediately elevated.  Has pulses at this time.  Call placed to Dr. Mardelle Matte orthopedist for evaluation for compartment syndrome.  Pain treated with Dilaudid and oral Valium.  Discussed with Dr Mardelle Matte (1145pm) who immediately called the operating room and arranged for fasciotomies of the left forearm for clinical compartment syndrome.  Patient and family updated.   With elevation his pain seemed to be improving somewhat but was still having pain with range of motion of his left wrist.  Final Clinical Impressions(s) / ED Diagnoses   Final diagnoses:  Traumatic compartment syndrome of left upper extremity, initial encounter [facility])    ED Discharge Orders    None       Jola Schmidt, MD 05/27/18 0040

## 2018-05-27 NOTE — Progress Notes (Signed)
Faxed with confirmation to [employer] claim adjuster Ivar DrapeLaura Nix tel#[phone]-fax#[phone]-H&P,op note,demographic sheet.

## 2018-05-27 NOTE — Evaluation (Signed)
Occupational Therapy Evaluation Patient Details Name: Francisco Smith MRN: [id number] DOB: [birth year] Today's Date: 05/27/2018    History of Present Illness  Francisco Smith is a  47 y.o. male who cut himself with a box cutter, at work earlier today, had a closure performed in a nearby emergency room/urgent care, and presented back to the emergency room with increasing pain and swelling and pain with passive motion of the fingers with increasing paresthesias.  Pt had sx per Dr Dion Saucier for compartment sydrome   Clinical Impression   OT eval and education complete.      Follow Up Recommendations  Outpatient OT          Precautions / Restrictions Precautions Precaution Comments: Ace wrap and wound VAC LUE      Mobility Bed Mobility Overal bed mobility: Independent                Transfers Overall transfer level: Independent                    Balance Overall balance assessment: Independent                                         ADL either performed or assessed with clinical judgement   ADL Overall ADL's : Needs assistance/impaired                                       General ADL Comments: wife will a as needed.  Pt I walking to bathroom.   Educated on LUE shoulder ROM and finger ROM as well as elevation for edema control. Pts fingers with good extension but needed A fully flexing     Vision Patient Visual Report: No change from baseline              Pertinent Vitals/Pain Pain Assessment: 0-10 Pain Score: 4  Pain Descriptors / Indicators: Aching;Discomfort Pain Intervention(s): Limited activity within patient's tolerance;Monitored during session     Hand Dominance Right   Extremity/Trunk Assessment Upper Extremity Assessment Upper Extremity Assessment: LUE deficits/detail LUE Deficits / Details: LUe s/p surgery. Educated on LUE shoulder ROM and finger ROM as well as elevation for edema control            Communication     Cognition Arousal/Alertness: Awake/alert Behavior During Therapy: WFL for tasks assessed/performed Overall Cognitive Status: Within Functional Limits for tasks assessed                                                Home Living Family/patient expects to be discharged to:: Private residence Living Arrangements: Spouse/significant other;Children Available Help at Discharge: Family                                             OT Problem List: Impaired UE functional use      OT Treatment/Interventions:      OT Goals(Current goals can be found in the care plan section) Acute Rehab OT Goals Patient Stated Goal: get well OT Goal Formulation: With patient  OT Frequency:      AM-PAC PT "6 Clicks" Daily Activity     Outcome Measure Help from another person eating meals?: A Little Help from another person taking care of personal grooming?: A Little Help from another person toileting, which includes using toliet, bedpan, or urinal?: None Help from another person bathing (including washing, rinsing, drying)?: A Little Help from another person to put on and taking off regular upper body clothing?: A Little Help from another person to put on and taking off regular lower body clothing?: A Little 6 Click Score: 19   End of Session Nurse Communication: Mobility status  Activity Tolerance: Patient tolerated treatment well Patient left: in bed;with call bell/phone within reach;with family/visitor present                   Time: [id number] OT Time Calculation (min): 21 min Charges:  OT General Charges $OT Visit: 1 Visit OT Evaluation $OT Eval Moderate Complexity: 1 [address]Mod  Lori Pepper [place], [place] [phone]  Alba CoryREDDING, Karlita Lichtman D 05/27/2018, 1:54 PM

## 2018-05-27 NOTE — Transfer of Care (Signed)
Immediate Anesthesia Transfer of Care Note  Patient: Francisco Smith  Procedure(s) Performed: FASCIOTOMY LEFT FOREARM (Left ) APPLICATION OF WOUND VAC (Left Arm Lower)  Patient Location: PACU  Anesthesia Type:General  Level of Consciousness: awake, alert , oriented and patient cooperative  Airway & Oxygen Therapy: Patient Spontanous Breathing and Patient connected to face mask oxygen  Post-op Assessment: Report given to RN, Post -op Vital signs reviewed and stable and Patient moving all extremities X 4  Post vital signs: stable  Last Vitals:  Vitals Value Taken Time  BP 145/81 05/27/2018  2:05 AM  Temp 37 C 05/27/2018  2:05 AM  Pulse 84 05/27/2018  2:09 AM  Resp 19 05/27/2018  2:09 AM  SpO2 100 % 05/27/2018  2:09 AM  Vitals shown include unvalidated device data.  Last Pain:  Vitals:   05/26/18 2218  TempSrc:   PainSc: 10-Worst pain ever      Patients Stated Pain Goal: 2 (05/26/18 2207)  Complications: No apparent anesthesia complications

## 2018-05-27 NOTE — H&P (Signed)
PREOPERATIVE H&P  Chief Complaint: Left arm pain  HPI: Francisco Smith is a 47 y.o. male who presents for preoperative history and physical with a diagnosis of left arm laceration, compartment syndrome.  He cut his left arm today with a box cutter, was seen in urgent care, the wounds were irrigated, and closed.  He said that he actually stabbed himself more than cut himself, it was fairly deep, and he developed substantial progressive increasing pain over the following couple of hours.  He has also noted loss of sensation in portions of the hand.  The pain is now gotten severe, he is unable to use his hand.  Seen in the emergency room, found to have pain with passive motion of the fingers.  Past Medical History:  Diagnosis Date  . Atypical chest pain   . Type II or unspecified type diabetes mellitus without mention of complication, not stated as uncontrolled    History reviewed. No pertinent surgical history. Social History   Socioeconomic History  . Marital status: Married    Spouse name: Not on file  . Number of children: Not on file  . Years of education: Not on file  . Highest education level: Not on file  Occupational History  . Occupation: Scientist, clinical (histocompatibility and immunogenetics): [employer]  . Financial resource strain: Not on file  . Food insecurity:    Worry: Not on file    Inability: Not on file  . Transportation needs:    Medical: Not on file    Non-medical: Not on file  Tobacco Use  . Smoking status: Never Smoker  . Smokeless tobacco: Never Used  Substance and Sexual Activity  . Alcohol use: Yes    Comment: "socially"  . Drug use: No  . Sexual activity: Yes  Lifestyle  . Physical activity:    Days per week: Not on file    Minutes per session: Not on file  . Stress: Not on file  Relationships  . Social connections:    Talks on phone: Not on file    Gets together: Not on file    Attends religious service: Not on file    Active member of club or  organization: Not on file    Attends meetings of clubs or organizations: Not on file    Relationship status: Not on file  Other Topics Concern  . Not on file  Social History Narrative  . Not on file   Family History  Problem Relation Age of Onset  . Asthma Mother   . Diabetes Father   . Allergies Son   . Asthma Son    No Known Allergies Prior to Admission medications   Medication Sig Start Date End Date Taking? Authorizing Provider  acetaminophen (TYLENOL) 325 MG tablet Take 650 mg by mouth every 6 (six) hours as needed for pain (headache).     [provider]  amoxicillin (AMOXIL) 500 MG capsule Take 1 capsule (500 mg total) by mouth 3 (three) times daily. 12/13/15   Lance Bosch, NP  atorvastatin (LIPITOR) 40 MG tablet Take 1 tablet (40 mg total) by mouth daily. 12/18/15   Lance Bosch, NP  cetirizine (ZYRTEC) 10 MG tablet Take 10 mg by mouth daily as needed for allergies.    [provider]  GlucoCom Lancets MISC Check blood sugar TID & QHS 10/12/14   Chari Manning A, NP  glucose blood (CHOICE DM FORA G20 TEST STRIPS) test strip Use as  instructed 10/12/14   Lance Bosch, NP  glucose monitoring kit (FREESTYLE) monitoring kit 1 each by Does not apply route as needed for other. Check blood sugar TID & QHS 10/12/14   Lance Bosch, NP  lisinopril (PRINIVIL,ZESTRIL) 2.5 MG tablet Take 1 tablet (2.5 mg total) by mouth daily. 12/13/15   Lance Bosch, NP  meclizine (ANTIVERT) 25 MG tablet Take 1 tablet (25 mg total) by mouth 3 (three) times daily as needed for dizziness. Patient not taking: Reported on 10/11/2014 08/06/13   Cleatrice Burke, PA-C  meloxicam (MOBIC) 15 MG tablet Take 1 tablet (15 mg total) by mouth daily. 12/13/15   Lance Bosch, NP  metFORMIN (GLUCOPHAGE) 500 MG tablet Take 1 tablet (500 mg total) by mouth 2 (two) times daily with a meal. 12/13/15   Lance Bosch, NP     Positive ROS: All other systems have been reviewed and were otherwise  negative with the exception of those mentioned in the HPI and as above.  Physical Exam: General: Alert, no acute distress Cardiovascular: No pedal edema Respiratory: No cyanosis, no use of accessory musculature GI: No organomegaly, abdomen is soft and non-tender Skin: He has a traumatic wound that has been sutured closed over the volar mid forearm Neurologic: Sensation intact distally Psychiatric: Patient is competent for consent with normal mood and affect Lymphatic: No axillary or cervical lymphadenopathy  MUSCULOSKELETAL: Left arm has significant soft tissue swelling and feels very tight.  He will actively flex and extend all of his fingers, but he has fairly significant pain with both active and passive motion of the fingers.  He reports paresthesias in the thumb and to a lesser degree the index finger and to some degree of the long finger.  He says he can feel them however.  His carpal tunnel feels soft.  He does not have a lot of swelling in the hand.  The forearm is however fairly tight.  Flexion is intact actively at the DIP and PIP joints in all of the fingers.  He has bounding radial and ulnar pulses.  Assessment: Left forearm stab wound with a box cutter, with presentation highly concerning for compartment syndrome   Plan: Plan for Procedure(s): Left forearm fasciotomy, exploration, repair of injured structures, possible wound VAC placement area  The risks benefits and alternatives were discussed with the patient including but not limited to the risks of nonoperative treatment, versus surgical intervention including infection, bleeding, nerve injury,  blood clots, cardiopulmonary complications, morbidity, mortality, among others, and they were willing to proceed.  We also discussed the potential need for further surgery, the potential for grafting, permanent nerve, muscle, and limb dysfunction and even loss of limb.  Plan to proceed emergently.   Johnny Bridge, MD Cell [phone]   05/27/2018 12:38 AM

## 2018-05-27 NOTE — Progress Notes (Signed)
Per workers comp Vernona RiegerLaura they will review info once received in order to process for their vendors.

## 2018-05-27 NOTE — Discharge Instructions (Signed)
Fasciotomy for Compartment Syndrome, Care After Refer to this sheet in the next few weeks. These instructions provide you with information about caring for yourself after your procedure. Your health care provider may also give you more specific instructions. Your treatment has been planned according to current medical practices, but problems sometimes occur. Call your health care provider if you have any problems or questions after your procedure. What can I expect after the procedure? After the procedure, it is common to have:  Pain.  Itching.  Stiffness.  Numbness or tingling.  Clear or yellow fluid draining from the incision.  Follow these instructions at home: Incision care  Follow instructions from your surgeon about: ? How to take care of your incision. ? When and how you should change your bandage (dressing). If your incision is open, a wound care specialist may come to your home to change the dressing. ? When you should remove your dressing.  Keep the incision area dry. Do not take baths, swim, or use a hot tub until your surgeon approves. Ask your surgeon when you can take a shower.  Check your incision every day for signs of infection. Watch for: ? Redness, swelling, or pain. ? Pus or blood. ? Fluid drainage that continues for longer than one week. Managing pain, stiffness, and swelling  Move your fingers or toes often to avoid stiffness and swelling.  Raise (elevate) the injured area above the level of your heart as much as possible. Activity  Ask your surgeon what activities are safe for you.  If the procedure was done on your leg or foot, do not stand or walk until your surgeon says you may.  If the procedure was done on your hand or arm, avoid any activity that involves lifting, carrying, gripping, or twisting until your surgeon says you may. General instructions  Take over-the-counter and prescription medicines only as told by your surgeon. Do not take  aspirin or other NSAIDs unless your surgeon has approved.These types of medicine increase your risk of bleeding.  Keep all follow-up visits as told by your surgeon. This is important.  You may need to see a physical therapist or occupational therapist. This will help your recovery. Do any exercises as instructed by your physical or occupational therapist.  Do not use any tobacco products, including cigarettes, chewing tobacco, or e-cigarettes. Tobacco can delay healing. If you need help quitting, ask your health care provider. Contact a health care provider if:   You have a fever.  Your pain is not controlled with medicine.  Your incision becomes red, swollen, or painful.  You have fluid, including blood or pus, coming from your incision for longer than one week.  You feel nauseous or vomit and it does not go away. Get help right away if:  You have increased swelling in your arm or leg.  You have increasing or severe pain.  You have chest pain.  You have trouble breathing.  You lose feeling in your arm or leg. This information is not intended to replace advice given to you by your health care provider. Make sure you discuss any questions you have with your health care provider. Document Released: 01/15/2009 Document Revised: 03/26/2016 Document Reviewed: 03/04/2015 Elsevier Interactive Patient Education  2018 [employer]   Acute Compartment Syndrome Compartment syndrome is a painful condition that occurs when swelling and pressure build up in a body space (compartment) of the arms or legs. Groups of muscles, nerves, and blood vessels in the arms and  legs are separated into various compartments. Each compartment is surrounded by tough layers of tissue (fascia). In compartment syndrome, pressure builds up within the layers of fascia and begins to push on the structures within that compartment. In acute compartment syndrome, the pressure builds up suddenly, often as the result  of an injury. If pressure continues to increase, it can block the flow of blood in the smallest blood vessels (capillaries). Then the muscles in the compartment cannot get enough oxygen and nutrients and will start to die within 4-6 hours. The nerves will begin to die within 12-24 hours. This condition is a medical emergency that must be treated with surgery. What are the causes? This condition may be caused by:  Injury. Some injuries can cause swelling or bleeding in a compartment. This can lead to compartment syndrome. Injuries that may cause this problem include: ? Broken bones, especially the long bones of the arms and legs. ? Crushing injuries. ? Penetrating injuries, such as a knife wound. ? Badly bruised muscles. ? Poisonous bites, such as a snake bite. ? Severe burns.  Blocked blood flow. This could be a result of: ? A cast or bandage that is too tight. ? A surgical procedure. Blood flow sometimes has to be stopped for a while during a surgery, usually with a tourniquet. ? Lying for too long in a position that restricts blood flow. This can happen in people who have nerve damage or if a person is unconscious for a long time. ? Medicines used to build up muscles (anabolic steroids). ? Medicines that keep the blood from forming clots (blood thinners).  What are the signs or symptoms? The most common symptom of this condition is pain. The pain:  May be far more severe than it should be for the injury you have.  May get worse: ? When moving or stretching the affected body part. ? When the area is pushed or squeezed. ? When raising (elevating) affected body part above the level of the heart.  May come with a feeling of tingling or burning.  May not get better when you take pain medicine.  Other symptoms include:  A feeling of tightness or fullness in the affected area.  A loss of feeling.  Weakness in the area.  Loss of movement.  Skin becoming pale, tight, and shiny  over the painful area.  Warmth and tenderness.  Tensing when the affected area is touched.  How is this diagnosed? This condition may be diagnosed based on:  Your physical exam and symptoms.  Measuring the pressure in the affected area (compartment pressure measurement).  Tests to rule out other problems, such as: ? X-rays. ? Blood tests. ? Ultrasound.  How is this treated? Treatment for this condition uses a procedure called fasciotomy. In this procedure, incisions are made through the fascia to relieve the pressure in the compartment and to prevent permanent damage. Before the surgery, first-aid treatment is done, which may include:  Treating any injury.  Loosening or removing any cast, bandage, or external wrap that may be causing pain.  Elevating the painful arm or leg to the same level as the heart.  Giving oxygen.  Giving fluids through an IV tube.  Pain medicine.  Summary  Compartment syndrome occurs when swelling and pressure build up in a body space (compartment) of the arms or legs.  First aid treatment may include loosening or removing a cast, bandage, or wrap and elevating the painful arm or leg at the level of  the heart.  In acute compartment syndrome, the pressure builds up suddenly, often as the result of an injury.  This condition is a medical emergency that must be treated with a surgical procedure called fasciotomy. This procedure relieves the pressure and prevents permanent damage. This information is not intended to replace advice given to you by your health care provider. Make sure you discuss any questions you have with your health care provider. Document Released: 10/07/2009 Document Revised: 10/08/2016 Document Reviewed: 10/08/2016 Elsevier Interactive Patient Education  2017 [employer].

## 2018-05-27 NOTE — Discharge Summary (Signed)
Patient doing well approximately 12 hours after fasciotomy.  On exam his wound VAC is intact and has a good seal.  Sensation is improved throughout his hand it is now returned in the median nerve distribution in all fingers flex extend and abduct.  He has a little difficulty bringing his thumb across to his small finger.  He has good capillary refill.  Ulnar and radial nerve sensation is also intact.  Impression left forearm fasciotomy for compartment syndrome  Plan: we are arranging for the outpatient wound VAC, he is going to need delayed primary closure versus grafting in the next week or 2.  I will see him in the office next week.  To keep him overnight for IV antibiotics tonight, as well as monitoring of his neurovascular function, and likely discharge tomorrow as long as he does not develop recurrent compartment syndrome.  Francisco PostJoshua P Riccardo Holeman, MD

## 2018-05-27 NOTE — Care Management Note (Signed)
Faxed w/confirmation all [employer] required forms to [employer] rep Tommy-he confirmed receipt-he will work on wound vac auth,& process for delivery to hospital once auth processed. MD/Nsg updated.[facility] rep Clydie BraunKaren aware of HHC orders,following for auth for HHC from [employer].

## 2018-05-27 NOTE — Care Management Note (Signed)
Case Management Note  Patient Details  Name: Francisco Smith MRN: [id number] Date of Birth: 12/08/1970  Subjective/Objective:  Received note to call Philemon KingdomMichael [phone]-he informed me of d/c plan home w/wound vac-no preference-patient has no health insurance-[facility] rep Clydie BraunKaren contacted for NWPT device-she will screen & put form on chart for Dr. Dion SaucierLandau signature, & completion of form in order to process for NWPT timely. Once confirmed of acceptance & NWPT device-will need orders:HHRN, HH social worker, & NWPT-change dsg order. Nsg updated.                 Action/Plan:d/c home w/HHC/NWPT device   Expected Discharge Date:                  Expected Discharge Plan:  Home w Home Health Services  In-House Referral:     Discharge planning Services  CM Consult  Post Acute Care Choice:    Choice offered to:  Patient  DME Arranged:    DME Agency:  [facility] Other - Comment(Negative Wound Therapy Pressure Device)  HH Arranged:  RN, Social Work [facility] [employer]  [facility]  Status of Service:  In process, will continue to follow  If discussed at Long Length of Stay Meetings, dates discussed:    Additional Comments:  Francisco Smith, Francisco Illescas, RN 05/27/2018, 9:31 AM

## 2018-05-27 NOTE — Plan of Care (Signed)
  Problem: Health Behavior/Discharge Planning: Goal: Ability to manage health-related needs will improve 05/27/2018 1717 by Iantha Fallenlark, Kathleena Freeman E, RN Outcome: Progressing 05/27/2018 1234 by Iantha Fallenlark, Rekia Kujala E, RN Outcome: Progressing   Problem: Clinical Measurements: Goal: Ability to maintain clinical measurements within normal limits will improve 05/27/2018 1717 by Iantha Fallenlark, Delorice Bannister E, RN Outcome: Progressing 05/27/2018 1234 by Iantha Fallenlark, Jaben Benegas E, RN Outcome: Progressing   Problem: Clinical Measurements: Goal: Will remain free from infection Outcome: Progressing   Problem: Clinical Measurements: Goal: Diagnostic test results will improve Outcome: Progressing   Problem: Clinical Measurements: Goal: Respiratory complications will improve Outcome: Progressing

## 2018-05-27 NOTE — Anesthesia Postprocedure Evaluation (Signed)
Anesthesia Post Note  Patient: Francisco Smith  Procedure(s) Performed: FASCIOTOMY LEFT FOREARM (Left ) APPLICATION OF WOUND VAC (Left Arm Lower)     Patient location during evaluation: PACU Anesthesia Type: General Level of consciousness: awake and alert and oriented Pain management: pain level controlled Vital Signs Assessment: post-procedure vital signs reviewed and stable Respiratory status: spontaneous breathing, nonlabored ventilation and respiratory function stable Cardiovascular status: blood pressure returned to baseline and stable Postop Assessment: no apparent nausea or vomiting Anesthetic complications: no    Last Vitals:  Vitals:   05/27/18 0220 05/27/18 0230  BP:  135/80  Pulse: (!) 57 64  Resp: 11 13  Temp:    SpO2: 97% 98%    Last Pain:  Vitals:   05/27/18 0230  TempSrc:   PainSc: 0-No pain                 Cyra Spader A.

## 2018-05-27 NOTE — Anesthesia Preprocedure Evaluation (Signed)
Anesthesia Evaluation  Patient identified by MRN, date of birth, ID band Patient awake    Reviewed: Allergy & Precautions, NPO status , Patient's Chart, lab work & pertinent test results  Airway Mallampati: I  TM Distance: >3 FB Neck ROM: Full    Dental  (+) Chipped,    Pulmonary neg pulmonary ROS,    Pulmonary exam normal breath sounds clear to auscultation       Cardiovascular negative cardio ROS Normal cardiovascular exam Rhythm:Regular Rate:Normal  Chest pain earlier today-  EKG- NSR early repolarization pattern   Neuro/Psych Anxiety negative neurological ROS     GI/Hepatic negative GI ROS, Neg liver ROS, N/V   Endo/Other  diabetes, Poorly Controlled, Type 2  Renal/GU negative Renal ROS  negative genitourinary   Musculoskeletal Compartment syndrome left forearm   Abdominal   Peds  Hematology negative hematology ROS (+)   Anesthesia Other Findings   Reproductive/Obstetrics                             Anesthesia Physical Anesthesia Plan  ASA: II  Anesthesia Plan: General   Post-op Pain Management:    Induction: Intravenous  PONV Risk Score and Plan:   Airway Management Planned: Oral ETT  Additional Equipment:   Intra-op Plan:   Post-operative Plan: Extubation in OR  Informed Consent: I have reviewed the patients History and Physical, chart, labs and discussed the procedure including the risks, benefits and alternatives for the proposed anesthesia with the patient or authorized representative who has indicated his/her understanding and acceptance.   Dental advisory given  Plan Discussed with: CRNA and Surgeon  Anesthesia Plan Comments:         Anesthesia Quick Evaluation

## 2018-05-27 NOTE — Anesthesia Procedure Notes (Signed)
Procedure Name: Intubation Date/Time: 05/27/2018 12:54 AM Performed by: Lissa Morales, CRNA Pre-anesthesia Checklist: Patient identified, Emergency Drugs available, Suction available and Patient being monitored Patient Re-evaluated:Patient Re-evaluated prior to induction Oxygen Delivery Method: Circle system utilized Preoxygenation: Pre-oxygenation with 100% oxygen Induction Type: IV induction Ventilation: Mask ventilation without difficulty Laryngoscope Size: Mac and 4 Tube type: Oral Tube size: 7.5 mm Number of attempts: 1 Airway Equipment and Method: Stylet and Oral airway Placement Confirmation: ETT inserted through vocal cords under direct vision,  positive ETCO2 and breath sounds checked- equal and bilateral Secured at: 22 cm Tube secured with: Tape Dental Injury: Teeth and Oropharynx as per pre-operative assessment
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
## 2018-05-27 NOTE — Discharge Summary (Signed)
Physician Discharge Summary  Patient ID: Francisco Smith MRN: [id number] DOB/AGE: 49-Jan-1972 49 y.o.  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 Atypical chest pain   . Type II or unspecified type diabetes mellitus without mention of complication, not stated as uncontrolled     Surgeries: Procedure(s): FASCIOTOMY LEFT FOREARM APPLICATION OF WOUND VAC on 05/27/2018   Consultants (if any):   Discharged Condition: Improved  Hospital Course: Francisco Smith is an 47 y.o. male who was admitted 05/26/2018 with a diagnosis of Traumatic compartment syndrome of left upper extremity ([place]) and went to the operating room on 05/27/2018 and underwent the above named procedures.    He was given perioperative antibiotics:  Anti-infectives (From admission, onward)   Start     Dose/Rate Route Frequency Ordered Stop   05/27/18 0600  ceFAZolin (ANCEF) IVPB 1 g/50 mL premix     1 g 100 mL/hr over 30 Minutes Intravenous Every 8 hours 05/27/18 0426     05/27/18 0130  ceFAZolin (ANCEF) IVPB 2 g/50 mL premix     2 g 100 mL/hr over 30 Minutes Intravenous  Once 05/27/18 0119 05/27/18 0103   05/27/18 0020  ceFAZolin (ANCEF) 2-4 GM/100ML-% IVPB    Note to Pharmacy:  Enrigue Catena   : cabinet override      05/27/18 0020 05/27/18 1229   05/27/18 0000  cephALEXin (KEFLEX) 500 MG capsule     500 mg Oral 4 times daily 05/27/18 1543      .  He was given sequential compression devices, early ambulation for DVT prophylaxis.  He was neurovascularly intact in his left hand POD 1.  Observed for IV abx and neurovascular monitoring.   He benefited maximally from the hospital stay and there were  no complications.    Recent vital signs:  Vitals:   05/27/18 1000 05/27/18 1250  BP: 132/81 135/81  Pulse: (!) 47 (!) 56  Resp:  16  Temp:  98.6 F (37 C)  SpO2:  99%    Recent laboratory studies:  Lab Results  Component Value Date   HGB 14.7 05/26/2018   HGB 15.8 10/11/2014   HGB 14.7 08/06/2013   Lab Results  Component Value Date   WBC 11.8 (H) 05/26/2018   PLT 221 05/26/2018   No results found for: INR Lab Results  Component Value Date   NA 140 05/26/2018   K 3.8 05/26/2018   CL 108 05/26/2018   CO2 23 05/26/2018   BUN 16 05/26/2018   CREATININE 0.93 05/26/2018   GLUCOSE 184 (H) 05/26/2018    Discharge Medications:   Allergies as of 05/27/2018   No Known Allergies     Medication List    STOP taking these medications   amoxicillin 500 MG capsule Commonly known as:  AMOXIL   meloxicam 15 MG tablet Commonly known as:  MOBIC     TAKE these medications   atorvastatin 40 MG tablet Commonly known as:  LIPITOR Take 1 tablet (40 mg total) by mouth daily.   cephALEXin 500 MG capsule Commonly known as:  KEFLEX Take 1 capsule (500 mg total) by mouth 4 (four) times daily.   GlucoCom Lancets Misc Check blood sugar TID & QHS  glucose blood test strip Commonly known as:  CHOICE DM FORA G20 TEST STRIPS Use as instructed   glucose monitoring kit monitoring kit 1 each by Does not apply route as needed for other. Check blood sugar TID & QHS   HYDROcodone-acetaminophen 10-325 MG tablet Commonly known as:  NORCO Take 1 tablet by mouth every 6 (six) hours as needed.   lisinopril 2.5 MG tablet Commonly known as:  PRINIVIL,ZESTRIL Take 1 tablet (2.5 mg total) by mouth daily.   meclizine 25 MG tablet Commonly known as:  ANTIVERT Take 1 tablet (25 mg total) by mouth 3 (three) times daily as needed for dizziness.   metFORMIN 500 MG tablet Commonly known as:  GLUCOPHAGE Take 1 tablet (500 mg total) by mouth 2 (two) times daily with a meal.    sennosides-docusate sodium 8.6-50 MG tablet Commonly known as:  SENOKOT-S Take 2 tablets by mouth daily.       Diagnostic Studies: Dg Chest 2 View  Result Date: 05/26/2018 CLINICAL DATA:  Left-sided chest pain, nausea, and vomiting since 8 a.m. Left arm pain and numbness. EXAM: CHEST - 2 VIEW COMPARISON:  10/27/2017 FINDINGS: Slightly shallow inspiration. Normal heart size and pulmonary vascularity. No focal airspace disease or consolidation in the lungs. No blunting of costophrenic angles. No pneumothorax. Mediastinal contours appear intact. IMPRESSION: No active cardiopulmonary disease. Electronically Signed   By: Lucienne Capers M.D.   On: 05/26/2018 22:36    Disposition:        Signed: Johnny Bridge 05/27/2018, 3:45 PM

## 2018-05-27 NOTE — Care Management Note (Signed)
Case Management Note  Patient Details  Name: Francisco Smith MRN: [id number] Date of Birth: [birth year]  Subjective/Objective:Per [employer] rep [name] they will provide [employer] wound vac for home-he will provide forms & process for completion of necessary documentation. [facility] will provide HHRN-wound vac dsg changes, HH Child psychotherapistsocial worker.                    Action/Plan:d/c home w/HHC/wound vac   Expected Discharge Date:                  Expected Discharge Plan:  Home w Home Health Services  In-House Referral:     Discharge planning Services  CM Consult  Post Acute Care Choice:    Choice offered to:  Patient  DME Arranged:    DME Agency:  [employer]  HH Arranged:  RN, Social Work [employer]  Status of Service:  In process, will continue to follow  If discussed at Long Length of Stay Meetings, dates discussed:    Additional Comments:  Lanier ClamMahabir, [name], RN 05/27/2018, 10:31 AM

## 2018-05-27 NOTE — Progress Notes (Signed)
Patient has mentioned in process for Worker's comp-TC worker's comp office-[place]than [phone]-provided me with claim#[id number]-claim adjuster Laura Nix-tel#[phone] called & left vm while in rm with patient-call back# [id number]. TC Dr. Harvin HazelLandau office-on hold very long-TC OR & left message with Jeanice LimHolly since Dr. Dion SaucierLandau would be in OR @ 1p-for Dr. Dion SaucierLandau to sing wound vac forms for [employer] to process-fomrs in shadow chart. CM will fax [facility] fax#[phone].

## 2018-05-27 NOTE — Plan of Care (Signed)
  Problem: Health Behavior/Discharge Planning: Goal: Ability to manage health-related needs will improve Outcome: Progressing   Problem: Clinical Measurements: Goal: Ability to maintain clinical measurements within normal limits will improve Outcome: Progressing   Problem: Clinical Measurements: Goal: Will remain free from infection Outcome: Progressing   Problem: Clinical Measurements: Goal: Diagnostic test results will improve Outcome: Progressing   

## 2018-05-28 LAB — GLUCOSE, CAPILLARY
Glucose-Capillary: 115 mg/dL — ABNORMAL HIGH (ref 70–99)
Glucose-Capillary: 153 mg/dL — ABNORMAL HIGH (ref 70–99)
Glucose-Capillary: 94 mg/dL (ref 70–99)
Glucose-Capillary: 160 mg/dL — ABNORMAL HIGH (ref 70–99)
Glucose-Capillary: 107 mg/dL — ABNORMAL HIGH (ref 70–99)

## 2018-05-28 MED ORDER — CEFAZOLIN SODIUM-DEXTROSE 2-4 GM/100ML-% IV SOLN
2.0000 g | Freq: Once | INTRAVENOUS | Status: DC
  Filled 2018-05-28: qty 100

## 2018-05-28 MED ORDER — OXYCODONE HCL 5 MG PO TABS: 5.0000 mg | ORAL_TABLET | ORAL | Status: DC | PRN

## 2018-05-28 NOTE — Progress Notes (Signed)
[facility] (OTS)  1 Day Post-Op Procedure(s) (LRB): FASCIOTOMY LEFT FOREARM (Left) APPLICATION OF WOUND VAC (Left)  Subjective: Patient reports pain as much improved, with increasing return of sensation in digits which are essentially normal now.    Objective: Current Vitals Blood pressure (!) 145/87, pulse (!) 56, temperature 98.5 F (36.9 C), temperature source Oral, resp. rate 16, height 5\' 8"  (1.727 m), weight 83.9 kg (185 lb), SpO2 99 %. Vital signs in last 24 hours: Temp:  [98 F (36.7 C)-98.6 F (37 C)] 98.5 F (36.9 C) (07/27 1422) Pulse Rate:  [50-57] 56 (07/27 1422) Resp:  [16] 16 (07/27 1422) BP: (124-145)/(74-87) 145/87 (07/27 1422) SpO2:  [97 %-99 %] 99 % (07/27 1422)  Intake/Output from previous day: 07/26 0701 - 07/27 0700 In: 1472.5 [P.O.:600; I.V.:672.5; IV Piggyback:200] Out: -   LABS Recent Labs    05/26/18 2215  HGB 14.7   Recent Labs    05/26/18 2215  WBC 11.8*  RBC 4.88  HCT 42.6  PLT 221   Recent Labs    05/26/18 2215  NA 140  K 3.8  CL 108  CO2 23  BUN 16  CREATININE 0.93  GLUCOSE 184*  CALCIUM 9.0   No results for input(s): LABPT, INR in the last 72 hours.   Physical Exam LUE  Splint in place; dreshing   Sens  Ax/R/M/U intact  Mot   Ax/ R/ PIN/ M/ AIN/ U intact  Brisk CR  Skin with improving mobility and wrinkling    Assessment/Plan: 1 Day Post-Op Procedure(s) (LRB): FASCIOTOMY LEFT FOREARM (Left) APPLICATION OF WOUND VAC (Left) 1. Spoke with Dr. Levada DyLandau--Will change plan to keep here and close primarily given excellent and early improvement in soft tissue swelling 2. Anticipate OR tomorrow or Monday 3. Convert to cock-up wrist splint today and continue aggressive ice and elevation  Myrene GalasMichael Iviana Blasingame, MD [facility] [phone] (p)

## 2018-05-29 ENCOUNTER — Inpatient Hospital Stay (HOSPITAL_COMMUNITY): Payer: No Typology Code available for payment source | Admitting: Anesthesiology

## 2018-05-29 ENCOUNTER — Encounter (HOSPITAL_COMMUNITY)
Admission: EM | Disposition: A | Discharge status: Home Health | Payer: Self-pay | Source: Home / Self Care | Attending: Orthopedic Surgery

## 2018-05-29 ENCOUNTER — Encounter (HOSPITAL_COMMUNITY): Payer: Self-pay | Admitting: Certified Registered Nurse Anesthetist

## 2018-05-29 HISTORY — PX: SECONDARY CLOSURE OF WOUND: SHX6208

## 2018-05-29 LAB — GLUCOSE, CAPILLARY
Glucose-Capillary: 133 mg/dL — ABNORMAL HIGH (ref 70–99)
Glucose-Capillary: 109 mg/dL — ABNORMAL HIGH (ref 70–99)
Glucose-Capillary: 131 mg/dL — ABNORMAL HIGH (ref 70–99)
Glucose-Capillary: 176 mg/dL — ABNORMAL HIGH (ref 70–99)
Glucose-Capillary: 138 mg/dL — ABNORMAL HIGH (ref 70–99)

## 2018-05-29 LAB — SURGICAL PCR SCREEN
MRSA, PCR: NEGATIVE
Staphylococcus aureus: NEGATIVE

## 2018-05-29 SURGERY — SECONDARY CLOSURE OF WOUND
Anesthesia: General | Site: Arm Lower | Laterality: Left

## 2018-05-29 MED ORDER — FENTANYL CITRATE (PF) 100 MCG/2ML IJ SOLN
INTRAMUSCULAR | Status: DC | PRN
  Administered 2018-05-29: 50 ug via INTRAVENOUS

## 2018-05-29 MED ORDER — DEXAMETHASONE SODIUM PHOSPHATE 10 MG/ML IJ SOLN
INTRAMUSCULAR | Status: DC | PRN
  Administered 2018-05-29: 5 mg via INTRAVENOUS

## 2018-05-29 MED ORDER — PROMETHAZINE HCL 25 MG/ML IJ SOLN: 6.2500 mg | INTRAMUSCULAR | Status: DC | PRN

## 2018-05-29 MED ORDER — FENTANYL CITRATE (PF) 100 MCG/2ML IJ SOLN
INTRAMUSCULAR | Status: AC
  Filled 2018-05-29: qty 2

## 2018-05-29 MED ORDER — KETOROLAC TROMETHAMINE 30 MG/ML IJ SOLN
INTRAMUSCULAR | Status: AC
  Filled 2018-05-29: qty 1

## 2018-05-29 MED ORDER — EPHEDRINE 5 MG/ML INJ
INTRAVENOUS | Status: AC
  Filled 2018-05-29: qty 10

## 2018-05-29 MED ORDER — EPHEDRINE SULFATE-NACL 50-0.9 MG/10ML-% IV SOSY
PREFILLED_SYRINGE | INTRAVENOUS | Status: DC | PRN
  Administered 2018-05-29: 10 mg via INTRAVENOUS

## 2018-05-29 MED ORDER — PROPOFOL 10 MG/ML IV BOLUS
INTRAVENOUS | Status: AC
  Filled 2018-05-29: qty 20

## 2018-05-29 MED ORDER — LIDOCAINE 2% (20 MG/ML) 5 ML SYRINGE
INTRAMUSCULAR | Status: DC | PRN
  Administered 2018-05-29: 60 mg via INTRAVENOUS

## 2018-05-29 MED ORDER — CEFAZOLIN SODIUM-DEXTROSE 2-4 GM/100ML-% IV SOLN
INTRAVENOUS | Status: AC
  Filled 2018-05-29: qty 100

## 2018-05-29 MED ORDER — ONDANSETRON HCL 4 MG/2ML IJ SOLN
INTRAMUSCULAR | Status: DC | PRN
  Administered 2018-05-29: 4 mg via INTRAVENOUS

## 2018-05-29 MED ORDER — FENTANYL CITRATE (PF) 100 MCG/2ML IJ SOLN
25.0000 ug | INTRAMUSCULAR | Status: DC | PRN
  Administered 2018-05-29 (×2): 25 ug via INTRAVENOUS

## 2018-05-29 MED ORDER — ONDANSETRON HCL 4 MG/2ML IJ SOLN
INTRAMUSCULAR | Status: AC
  Filled 2018-05-29: qty 2

## 2018-05-29 MED ORDER — MIDAZOLAM HCL 5 MG/5ML IJ SOLN
INTRAMUSCULAR | Status: DC | PRN
  Administered 2018-05-29: 2 mg via INTRAVENOUS

## 2018-05-29 MED ORDER — PROPOFOL 10 MG/ML IV BOLUS
INTRAVENOUS | Status: DC | PRN
  Administered 2018-05-29: 200 mg via INTRAVENOUS

## 2018-05-29 MED ORDER — 0.9 % SODIUM CHLORIDE (POUR BTL) OPTIME
TOPICAL | Status: DC | PRN
  Administered 2018-05-29: 1000 mL

## 2018-05-29 MED ORDER — SODIUM CHLORIDE 0.9 % IR SOLN
Status: DC | PRN
  Administered 2018-05-29: 3000 mL

## 2018-05-29 MED ORDER — FENTANYL CITRATE (PF) 250 MCG/5ML IJ SOLN
INTRAMUSCULAR | Status: AC
  Filled 2018-05-29: qty 5

## 2018-05-29 MED ORDER — MIDAZOLAM HCL 2 MG/2ML IJ SOLN
INTRAMUSCULAR | Status: AC
  Filled 2018-05-29: qty 2

## 2018-05-29 MED ORDER — KETOROLAC TROMETHAMINE 30 MG/ML IJ SOLN
30.0000 mg | Freq: Once | INTRAMUSCULAR | Status: AC | PRN
  Administered 2018-05-29: 30 mg via INTRAVENOUS

## 2018-05-29 MED ORDER — LACTATED RINGERS IV SOLN
INTRAVENOUS | Status: DC | PRN
  Administered 2018-05-29: 09:00:00 via INTRAVENOUS

## 2018-05-29 SURGICAL SUPPLY — 37 items
BANDAGE ACE 4X5 VEL STRL LF (GAUZE/BANDAGES/DRESSINGS) ×2 IMPLANT
BANDAGE ACE 6X5 VEL STRL LF (GAUZE/BANDAGES/DRESSINGS) ×4 IMPLANT
BNDG COHESIVE 4X5 TAN STRL (GAUZE/BANDAGES/DRESSINGS) ×4 IMPLANT
BNDG GAUZE ELAST 4 BULKY (GAUZE/BANDAGES/DRESSINGS) ×4 IMPLANT
COVER SURGICAL LIGHT HANDLE (MISCELLANEOUS) ×2 IMPLANT
CUFF TOURN SGL QUICK 24 (TOURNIQUET CUFF)
CUFF TOURN SGL QUICK 34 (TOURNIQUET CUFF)
CUFF TRNQT CYL 24X4X40X1 (TOURNIQUET CUFF) IMPLANT
CUFF TRNQT CYL 34X4X40X1 (TOURNIQUET CUFF) IMPLANT
DRAPE INCISE IOBAN 66X45 STRL (DRAPES) ×2 IMPLANT
DRAPE ORTHO SPLIT 77X108 STRL (DRAPES) ×2
DRAPE SURG ORHT 6 SPLT 77X108 (DRAPES) ×2 IMPLANT
DRAPE U-SHAPE 47X51 STRL (DRAPES) ×6 IMPLANT
DRSG ADAPTIC 3X8 NADH LF (GAUZE/BANDAGES/DRESSINGS) ×2 IMPLANT
DRSG PAD ABDOMINAL 8X10 ST (GAUZE/BANDAGES/DRESSINGS) ×2 IMPLANT
DURAPREP 26ML APPLICATOR (WOUND CARE) ×2 IMPLANT
ELECT REM PT RETURN 15FT ADLT (MISCELLANEOUS) ×2 IMPLANT
GAUZE SPONGE 4X4 12PLY STRL (GAUZE/BANDAGES/DRESSINGS) ×2 IMPLANT
GAUZE XEROFORM 1X8 LF (GAUZE/BANDAGES/DRESSINGS) ×2 IMPLANT
GLOVE BIOGEL PI IND STRL 9 (GLOVE) ×1 IMPLANT
GLOVE BIOGEL PI INDICATOR 9 (GLOVE) ×1
GLOVE SURG ORTHO 9.0 STRL STRW (GLOVE) ×2 IMPLANT
GOWN STRL REUS W/TWL LRG LVL3 (GOWN DISPOSABLE) ×4 IMPLANT
KIT BASIN OR (CUSTOM PROCEDURE TRAY) ×2 IMPLANT
KIT ROOM TURNOVER WLOR (KITS) ×2 IMPLANT
MANIFOLD NEPTUNE II (INSTRUMENTS) ×2 IMPLANT
NS IRRIG 1000ML POUR BTL (IV SOLUTION) ×2 IMPLANT
PACK GENERAL/GYN (CUSTOM PROCEDURE TRAY) ×2 IMPLANT
PAD ABD 8X10 STRL (GAUZE/BANDAGES/DRESSINGS) ×2 IMPLANT
SET MONITOR QUICK PRESSURE (MISCELLANEOUS) IMPLANT
SPONGE LAP 18X18 RF (DISPOSABLE) ×2 IMPLANT
STAPLER VISISTAT 35W (STAPLE) IMPLANT
STOCKINETTE 8 INCH (MISCELLANEOUS) ×4 IMPLANT
SUT ETHILON 2 0 FSLX (SUTURE) IMPLANT
SUT VIC AB 0 CTB1 27 (SUTURE) IMPLANT
SUT VIC AB 2-0 CTB1 (SUTURE) IMPLANT
TOWEL OR 17X26 10 PK STRL BLUE (TOWEL DISPOSABLE) ×2 IMPLANT

## 2018-05-29 NOTE — Anesthesia Postprocedure Evaluation (Signed)
Anesthesia Post Note  Patient: Francisco Smith  Procedure(s) Performed: CLOSURE OF LEFT FOREARM FASCIOTOMY (Left Arm Lower)     Patient location during evaluation: PACU Anesthesia Type: General Level of consciousness: awake and alert Pain management: pain level controlled Vital Signs Assessment: post-procedure vital signs reviewed and stable Respiratory status: spontaneous breathing, nonlabored ventilation, respiratory function stable and patient connected to nasal cannula oxygen Cardiovascular status: blood pressure returned to baseline and stable Postop Assessment: no apparent nausea or vomiting Anesthetic complications: no    Last Vitals:  Vitals:   05/29/18 1136 05/29/18 1315  BP: 119/79 119/80  Pulse: 62 85  Resp: 18 18  Temp: 36.9 C 36.9 C  SpO2: 97% 96%    Last Pain:  Vitals:   05/29/18 1315  TempSrc: Oral  PainSc:                  Zia Kanner S

## 2018-05-29 NOTE — Transfer of Care (Signed)
Immediate Anesthesia Transfer of Care Note  Patient: Francisco Smith  Procedure(s) Performed: CLOSURE OF LEFT FOREARM FASCIOTOMY (Left Arm Lower)  Patient Location: PACU  Anesthesia Type:General  Level of Consciousness: sedated, patient cooperative and responds to stimulation  Airway & Oxygen Therapy: Patient Spontanous Breathing and Patient connected to face mask oxygen  Post-op Assessment: Report given to RN and Post -op Vital signs reviewed and stable  Post vital signs: Reviewed and stable  Last Vitals:  Vitals Value Taken Time  BP 141/88 05/29/2018  9:47 AM  Temp    Pulse 87 05/29/2018  9:48 AM  Resp 14 05/29/2018  9:48 AM  SpO2 100 % 05/29/2018  9:48 AM  Vitals shown include unvalidated device data.  Last Pain:  Vitals:   05/29/18 0740  TempSrc:   PainSc: 4       Patients Stated Pain Goal: 2 (05/29/18 0740)  Complications: No apparent anesthesia complications

## 2018-05-29 NOTE — Anesthesia Preprocedure Evaluation (Signed)
Anesthesia Evaluation  Patient identified by MRN, date of birth, ID band Patient awake    Reviewed: Allergy & Precautions, NPO status , Patient's Chart, lab work & pertinent test results  Airway Mallampati: II  TM Distance: >3 FB Neck ROM: Full    Dental no notable dental hx.    Pulmonary neg pulmonary ROS,    Pulmonary exam normal breath sounds clear to auscultation       Cardiovascular negative cardio ROS Normal cardiovascular exam Rhythm:Regular Rate:Normal     Neuro/Psych negative neurological ROS  negative psych ROS   GI/Hepatic negative GI ROS, Neg liver ROS,   Endo/Other  diabetes, Insulin Dependent  Renal/GU negative Renal ROS  negative genitourinary   Musculoskeletal negative musculoskeletal ROS (+)   Abdominal   Peds negative pediatric ROS (+)  Hematology negative hematology ROS (+)   Anesthesia Other Findings   Reproductive/Obstetrics negative OB ROS                             Anesthesia Physical Anesthesia Plan  ASA: II  Anesthesia Plan: General   Post-op Pain Management:    Induction: Intravenous  PONV Risk Score and Plan: 2 and Ondansetron, Dexamethasone and Treatment may vary due to age or medical condition  Airway Management Planned: LMA  Additional Equipment:   Intra-op Plan:   Post-operative Plan:   Informed Consent: I have reviewed the patients History and Physical, chart, labs and discussed the procedure including the risks, benefits and alternatives for the proposed anesthesia with the patient or authorized representative who has indicated his/her understanding and acceptance.   Dental advisory given  Plan Discussed with: CRNA and Surgeon  Anesthesia Plan Comments:         Anesthesia Quick Evaluation

## 2018-05-29 NOTE — Op Note (Signed)
05/26/2018 - 05/29/2018  9:34 AM  PATIENT:  Francisco Smith    PRE-OPERATIVE DIAGNOSIS:    Left forearm compartment syndrome, status post fasciotomy   POST-OPERATIVE DIAGNOSIS:  Same  PROCEDURE:    1.  Irrigation and debridement, skin, subcutaneous tissue, muscle, fascia 2.  Delayed primary closure, 11 cm wound.  SURGEON:  Eulas PostJoshua P Tegh Franek, MD  PHYSICIAN ASSISTANT: None  ANESTHESIA:   General  PREOPERATIVE INDICATIONS:  Francisco Smith is a  47 y.o. male who had an emergent fasciotomy this past Thursday night for compartment syndrome who presented with substantial improvement in his swelling, and elected for delayed primary closure.  The risks benefits and alternatives were discussed with the patient preoperatively including but not limited to the risks of infection, bleeding, nerve injury, cardiopulmonary complications, recurrent compartment syndrome, the need for revision surgery, among others, and the patient was willing to proceed.  ESTIMATED BLOOD LOSS: Minimal  OPERATIVE IMPLANTS: None  OPERATIVE FINDINGS: All of the volar musculature appeared viable.  OPERATIVE PROCEDURE: The patient was brought to the operating room and placed in supine position.  General anesthesia was administered.  The left upper extremity was prepped and draped in usual sterile fashion.  Ancef was utilized.  Tourniquet was not utilized.  The wound was explored, and the muscular tract from the knife was reexamined.  This was irrigated copiously with 3 L of saline.  There was muscular damage, but no evidence for ongoing necrosis.  The compartment itself felt very soft and easily closable.  There was no active bleeding.  The skin was closed with nylon suture without undue tension.  Xeroform and sterile gauze applied.  He was awakened and returned to the PACU in stable and satisfactory condition.  There were no complications.

## 2018-05-29 NOTE — Anesthesia Procedure Notes (Signed)
Procedure Name: LMA Insertion Performed by: Kizzie Fantasiaarver, Nikholas Geffre J, CRNA Pre-anesthesia Checklist: Patient identified, Emergency Drugs available, Suction available and Patient being monitored Patient Re-evaluated:Patient Re-evaluated prior to induction Oxygen Delivery Method: Circle system utilized Preoxygenation: Pre-oxygenation with 100% oxygen Induction Type: IV induction Ventilation: Mask ventilation without difficulty LMA: LMA inserted LMA Size: 4.0 Number of attempts: 1 Placement Confirmation: positive ETCO2,  CO2 detector and breath sounds checked- equal and bilateral

## 2018-05-29 NOTE — Progress Notes (Signed)
The patient has been re-examined, and the chart reviewed, and there have been no interval changes to the documented history and physical.    The risks, benefits, and alternatives have been discussed at length, and the patient is willing to proceed.    Neurovascular hand function is intact.   Plan for delayed primary closure and I&D.   Eulas PostJoshua P Dantrell Schertzer, MD

## 2018-05-30 ENCOUNTER — Encounter (HOSPITAL_COMMUNITY): Payer: Self-pay | Admitting: Orthopedic Surgery

## 2018-05-30 LAB — GLUCOSE, CAPILLARY
Glucose-Capillary: 99 mg/dL (ref 70–99)
Glucose-Capillary: 147 mg/dL — ABNORMAL HIGH (ref 70–99)

## 2018-05-30 NOTE — Progress Notes (Signed)
Nurse discussed and educated patient on discharge papers. Nurse educated patient on pain management, constipation prevention, signs of compartment syndrome, signs of infection and medications prescribed. Nurse answered questions regarding financial assistance for prescription medications. Nurse educated patient on ADLs and dressing maintenance of surgical site.

## 2018-05-30 NOTE — Discharge Summary (Signed)
Physician Discharge Summary  Patient ID: Francisco Smith MRN: [id number] DOB/AGE: [birth year] 49 y.o.  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 Atypical chest pain   . Type II or unspecified type diabetes mellitus without mention of complication, not stated as uncontrolled     Surgeries: Procedure(s): Left forearm emergent fasciotomy with I&D on July 26, followed by  CLOSURE OF LEFT FOREARM FASCIOTOMY on 05/29/2018   Consultants (if any):   Discharged Condition: Improved  Hospital Course: Francisco Smith is an 47 y.o. 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    He was given perioperative antibiotics:  Anti-infectives (From admission, onward)   Start     Dose/Rate Route Frequency Ordered Stop   05/29/18 0830  ceFAZolin (ANCEF) IVPB 2g/100 mL premix     2 g 200 mL/hr over 30 Minutes Intravenous  Once 05/28/18 2120     05/29/18 0812  ceFAZolin (ANCEF) 2-4 GM/100ML-% IVPB    Note to Pharmacy:  Gust Rung   : cabinet override      05/29/18 0812 05/29/18 2029   05/27/18 0600  ceFAZolin (ANCEF) IVPB 1 g/50 mL premix     1 g 100 mL/hr over 30 Minutes Intravenous Every 8 hours 05/27/18 0426     05/27/18 0130  ceFAZolin (ANCEF) IVPB 2 g/50 mL premix     2 g 100 mL/hr over 30 Minutes Intravenous  Once 05/27/18 0119 05/27/18 0103   05/27/18 0020  ceFAZolin (ANCEF) 2-4 GM/100ML-% IVPB    Note to Pharmacy:  Enrigue Catena   : cabinet override      05/27/18 0020 05/27/18 1229   05/27/18 0000  cephALEXin (KEFLEX) 500  MG capsule     500 mg Oral 4 times daily 05/27/18 1543      .  He was given sequential compression devices, early ambulation for DVT prophylaxis.  He was neurovascularly intact after the fasciotomy, and had good resolution of symptoms.  Thankfully we were able to close his wound quickly.  He benefited maximally from the hospital stay and there were no complications.    Recent vital signs:  Vitals:   05/29/18 2211 05/30/18 0611  BP: (!) 137/91 130/71  Pulse: 84 80  Resp: 12 16  Temp: 98.3 F (36.8 C) 98.1 F (36.7 C)  SpO2: 98% 98%    Recent laboratory studies:  Lab Results  Component Value Date   HGB 14.7 05/26/2018   HGB 15.8 10/11/2014   HGB 14.7 08/06/2013   Lab Results  Component Value Date   WBC 11.8 (H) 05/26/2018   PLT 221 05/26/2018   No results found for: INR Lab Results  Component Value Date   NA 140 05/26/2018   K 3.8 05/26/2018   CL 108 05/26/2018   CO2 23 05/26/2018   BUN 16 05/26/2018   CREATININE 0.93 05/26/2018   GLUCOSE 184 (H) 05/26/2018    Discharge Medications:   Allergies as of 05/30/2018   No Known Allergies     Medication List    STOP taking these medications   amoxicillin 500 MG capsule Commonly  known as:  AMOXIL   meloxicam 15 MG tablet Commonly known as:  MOBIC     TAKE these medications   atorvastatin 40 MG tablet Commonly known as:  LIPITOR Take 1 tablet (40 mg total) by mouth daily.   cephALEXin 500 MG capsule Commonly known as:  KEFLEX Take 1 capsule (500 mg total) by mouth 4 (four) times daily.   GlucoCom Lancets Misc Check blood sugar TID & QHS   glucose blood test strip Commonly known as:  CHOICE DM FORA G20 TEST STRIPS Use as instructed   glucose monitoring kit monitoring kit 1 each by Does not apply route as needed for other. Check blood sugar TID & QHS   HYDROcodone-acetaminophen 10-325 MG tablet Commonly known as:  NORCO Take 1 tablet by mouth every 6 (six) hours as needed.   lisinopril 2.5 MG  tablet Commonly known as:  PRINIVIL,ZESTRIL Take 1 tablet (2.5 mg total) by mouth daily.   meclizine 25 MG tablet Commonly known as:  ANTIVERT Take 1 tablet (25 mg total) by mouth 3 (three) times daily as needed for dizziness.   metFORMIN 500 MG tablet Commonly known as:  GLUCOPHAGE Take 1 tablet (500 mg total) by mouth 2 (two) times daily with a meal.   sennosides-docusate sodium 8.6-50 MG tablet Commonly known as:  SENOKOT-S Take 2 tablets by mouth daily.       Diagnostic Studies: Dg Chest 2 View  Result Date: 05/26/2018 CLINICAL DATA:  Left-sided chest pain, nausea, and vomiting since 8 a.m. Left arm pain and numbness. EXAM: CHEST - 2 VIEW COMPARISON:  10/27/2017 FINDINGS: Slightly shallow inspiration. Normal heart size and pulmonary vascularity. No focal airspace disease or consolidation in the lungs. No blunting of costophrenic angles. No pneumothorax. Mediastinal contours appear intact. IMPRESSION: No active cardiopulmonary disease. Electronically Signed   By: Lucienne Capers M.D.   On: 05/26/2018 22:36    Disposition: Discharge disposition: 01-Home or Self Care         Follow-up Information    Marchia Bond, MD. Schedule an appointment as soon as possible for a visit in 1 week.   Specialty:  Orthopedic Surgery Contact information: [address] [place] [address] [phone]            Signed: Johnny Bridge 05/30/2018, 8:50 AM

## 2018-05-30 NOTE — Progress Notes (Addendum)
Patient ID: Francisco Smith, male   DOB: 05-02-1971, 47 y.o.   MRN: [id number]     Subjective:  Patient reports pain as mild.  Patient in bed and in no acute distress.  He reports that he is much better  Objective:   VITALS:   Vitals:   05/29/18 1729 05/29/18 2034 05/29/18 2211 05/30/18 0611  BP: (P) 120/70 121/74 (!) 137/91 130/71  Pulse: (P) 82 78 84 80  Resp: (P) 16 15 12 16   Temp: (P) 98.2 F (36.8 C) 98.8 F (37.1 C) 98.3 F (36.8 C) 98.1 F (36.7 C)  TempSrc: (P) Oral Oral Oral Oral  SpO2: (P) 98% 95% 98% 98%  Weight:      Height:        ABD soft Sensation intact distally Dorsiflexion/Plantar flexion intact Incision: dressing C/D/I and no drainage Dressing changed and wound is clean and dry  Lab Results  Component Value Date   WBC 11.8 (H) 05/26/2018   HGB 14.7 05/26/2018   HCT 42.6 05/26/2018   MCV 87.3 05/26/2018   PLT 221 05/26/2018   BMET    Component Value Date/Time   NA 140 05/26/2018 2215   K 3.8 05/26/2018 2215   CL 108 05/26/2018 2215   CO2 23 05/26/2018 2215   GLUCOSE 184 (H) 05/26/2018 2215   BUN 16 05/26/2018 2215   CREATININE 0.93 05/26/2018 2215   CREATININE 1.01 12/13/2015 1004   CALCIUM 9.0 05/26/2018 2215   GFRNONAA >60 05/26/2018 2215   GFRNONAA >89 10/11/2014 1005   GFRAA >60 05/26/2018 2215   GFRAA >89 10/11/2014 1005     Assessment/Plan: 1 Day Post-Op   Principal Problem:   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

## 2018-05-30 NOTE — Progress Notes (Signed)
Confirmed with Ivar DrapeLaura Nix tel#[phone] ([facility] Adjuster) that [facility]

## 2019-07-27 ENCOUNTER — Other Ambulatory Visit: Payer: Self-pay

## 2019-07-27 DIAGNOSIS — Z20822 Contact with and (suspected) exposure to covid-19: Secondary | ICD-10-CM

## 2019-07-28 LAB — NOVEL CORONAVIRUS, NAA: SARS-CoV-2, NAA: NOT DETECTED

## 2019-10-17 ENCOUNTER — Telehealth: Payer: Self-pay | Admitting: General Practice

## 2019-10-17 NOTE — Telephone Encounter (Signed)
Spoke to pt and offered to make appt to establish care at [facility].  Pt declined.  Offered virtual visit and explained self pay billing options. Pt declined multiple times to establish care.

## 2019-10-25 ENCOUNTER — Ambulatory Visit: Payer: HRSA Program | Attending: Internal Medicine

## 2019-10-25 DIAGNOSIS — U071 COVID-19: Secondary | ICD-10-CM | POA: Diagnosis present

## 2019-10-25 DIAGNOSIS — R238 Other skin changes: Secondary | ICD-10-CM | POA: Insufficient documentation

## 2019-10-26 LAB — NOVEL CORONAVIRUS, NAA: SARS-CoV-2, NAA: DETECTED — AB

## 2019-11-01 ENCOUNTER — Ambulatory Visit: Payer: 59 | Attending: Internal Medicine

## 2019-11-01 DIAGNOSIS — Z20828 Contact with and (suspected) exposure to other viral communicable diseases: Secondary | ICD-10-CM | POA: Insufficient documentation

## 2019-11-01 DIAGNOSIS — Z20822 Contact with and (suspected) exposure to covid-19: Secondary | ICD-10-CM

## 2019-11-02 LAB — NOVEL CORONAVIRUS, NAA: SARS-CoV-2, NAA: NOT DETECTED

## 2019-11-28 ENCOUNTER — Other Ambulatory Visit: Payer: Self-pay

## 2019-11-28 ENCOUNTER — Encounter: Payer: Self-pay | Admitting: Nurse Practitioner

## 2019-11-28 ENCOUNTER — Ambulatory Visit (INDEPENDENT_AMBULATORY_CARE_PROVIDER_SITE_OTHER): Payer: 59 | Admitting: Nurse Practitioner

## 2019-11-28 VITALS — BP 112/70 | HR 79 | Temp 97.3°F | Ht 68.0 in | Wt 201.8 lb

## 2019-11-28 DIAGNOSIS — Z125 Encounter for screening for malignant neoplasm of prostate: Secondary | ICD-10-CM

## 2019-11-28 DIAGNOSIS — L859 Epidermal thickening, unspecified: Secondary | ICD-10-CM | POA: Diagnosis not present

## 2019-11-28 DIAGNOSIS — Z23 Encounter for immunization: Secondary | ICD-10-CM | POA: Diagnosis not present

## 2019-11-28 DIAGNOSIS — E1165 Type 2 diabetes mellitus with hyperglycemia: Secondary | ICD-10-CM

## 2019-11-28 DIAGNOSIS — Z1211 Encounter for screening for malignant neoplasm of colon: Secondary | ICD-10-CM

## 2019-11-28 DIAGNOSIS — Z0000 Encounter for general adult medical examination without abnormal findings: Secondary | ICD-10-CM

## 2019-11-28 DIAGNOSIS — E782 Mixed hyperlipidemia: Secondary | ICD-10-CM | POA: Diagnosis not present

## 2019-11-28 LAB — COMPREHENSIVE METABOLIC PANEL
ALT: 28 U/L (ref 0–53)
AST: 24 U/L (ref 0–37)
Albumin: 4.3 g/dL (ref 3.5–5.2)
Alkaline Phosphatase: 88 U/L (ref 39–117)
BUN: 15 mg/dL (ref 6–23)
CO2: 25 mEq/L (ref 19–32)
Calcium: 9.3 mg/dL (ref 8.4–10.5)
Chloride: 105 mEq/L (ref 96–112)
Creatinine, Ser: 0.81 mg/dL (ref 0.40–1.50)
GFR: 100.74 mL/min (ref >60.00)
Glucose, Bld: 152 mg/dL — ABNORMAL HIGH (ref 70–99)
Potassium: 4.2 mEq/L (ref 3.5–5.1)
Sodium: 136 mEq/L (ref 135–145)
Total Bilirubin: 0.6 mg/dL (ref 0.2–1.2)
Total Protein: 7.4 g/dL (ref 6.0–8.3)

## 2019-11-28 LAB — CBC
HCT: 44.3 % (ref 39.0–52.0)
Hemoglobin: 14.9 g/dL (ref 13.0–17.0)
MCHC: 33.7 g/dL (ref 30.0–36.0)
MCV: 89 fl (ref 78.0–100.0)
Platelets: 226 10*3/uL (ref 150.0–400.0)
RBC: 4.97 Mil/uL (ref 4.22–5.81)
RDW: 12.7 % (ref 11.5–15.5)
WBC: 6.2 10*3/uL (ref 4.0–10.5)

## 2019-11-28 LAB — LIPID PANEL
Cholesterol: 184 mg/dL (ref 0–200)
HDL: 32 mg/dL — ABNORMAL LOW (ref >39.00)
LDL Cholesterol: 119 mg/dL — ABNORMAL HIGH (ref 0–99)
NonHDL: 151.96
Total CHOL/HDL Ratio: 6
Triglycerides: 163 mg/dL — ABNORMAL HIGH (ref 0.0–149.0)
VLDL: 32.6 mg/dL (ref 0.0–40.0)

## 2019-11-28 LAB — PSA: PSA: 0.38 ng/mL (ref 0.10–4.00)

## 2019-11-28 LAB — HEMOGLOBIN A1C: Hgb A1c MFr Bld: 6.7 % — ABNORMAL HIGH (ref 4.6–6.5)

## 2019-11-28 LAB — MICROALBUMIN / CREATININE URINE RATIO
Creatinine,U: 49.7 mg/dL
Microalb Creat Ratio: 1.4 mg/g (ref 0.0–30.0)
Microalb, Ur: <0.7 mg/dL (ref 0.0–1.9)

## 2019-11-28 LAB — TSH: TSH: 3.16 u[IU]/mL (ref 0.35–4.50)

## 2019-11-28 MED ORDER — ATORVASTATIN CALCIUM 40 MG PO TABS: 40.0000 mg | ORAL_TABLET | Freq: Every day | ORAL | 3 refills | Status: AC

## 2019-11-28 MED ORDER — METFORMIN HCL 500 MG PO TABS: 500.0000 mg | ORAL_TABLET | Freq: Every day | ORAL | 3 refills | Status: DC

## 2019-11-28 NOTE — Progress Notes (Signed)
Subjective:    Patient ID: Francisco Smith, male    DOB: 18-Jan-1971, 49 y.o.   MRN: [id number]  Patient presents today for complete physical and eval of DM  HPI  DM: diagnosed 15yr ago Current use of metformin. Maintains low carb diet Denies nay neuropathy. BP Readings from Last 3 Encounters:  11/28/19 112/70  05/30/18 137/82  10/27/17 121/76   Chronic dry and scaly skin since childhood, no improvement with mosturization. Needs referral to dermatology.  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 Pneumococcal vaccine  08/01/1971  . Eye exam for diabetics  08/01/1979  . Colon Cancer Screening  08/01/2019  . Hemoglobin A1C  05/27/2020  . Complete foot exam   11/27/2020  . Tetanus Vaccine  11/27/2029  . Flu Shot  Completed  . HIV Screening  Completed   Diet:regular.  Weight:  Wt Readings from Last 3 Encounters:  11/28/19 201 lb 12.8 oz (91.5 kg)  05/26/18 185 lb (83.9 kg)  12/13/15 201 lb (91.2 kg)    Exercise:walking daily  Fall Risk: Fall Risk  11/28/2019 10/11/2014  Falls in the past year? 0 No   Advanced Directive: Advanced Directives 05/27/2018  Does Patient Have a Medical Advance Directive? No  Would patient like information on creating a medical advance directive? No - Patient declined     Medications and allergies reviewed with patient and updated if appropriate.  Patient Active Problem List   Diagnosis Date Noted  . Mixed hyperlipidemia 11/28/2019  . Hyperkeratosis of skin 11/28/2019  . 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 Compartment syndrome of left upper extremity ([facility]CCC[place]7/26/2019  . DM (diabetes mellitus) ([facility]CC[place]2/14/2012    Current Outpatient Medications on File Prior to Visit  Medication Sig Dispense Refill  . glucose monitoring kit (FREESTYLE) monitoring kit 1 each by Does not apply route as needed for other. Check blood sugar TID & QHS 1 each 0  . GlucoCom Lancets MISC Check blood sugar TID & QHS (Patient not taking: Reported on 11/28/2019) 100 each 0  . glucose blood (CHOICE DM FORA G20 TEST STRIPS) test strip Use as instructed (Patient not taking: Reported on 11/28/2019) 100 each 12   No current facility-administered medications on file prior to visit.    Past Medical History:  Diagnosis Date  . Atypical chest pain   . Type II or unspecified type diabetes mellitus without mention of complication, not stated as uncontrolled     Past Surgical History:  Procedure Laterality Date  . APPLICATION OF WOUND VAC Left 05/27/2018   Procedure: APPLICATION OF WOUND VAC;  Surgeon: LanMarchia BondD;  Location: [facility];  Service: Orthopedics;  Laterality: Left;  . FASCIOTOMY Left 05/27/2018   Procedure: FASCIOTOMY LEFT FOREARM;  Surgeon: LanMarchia BondD;  Location: [facility];  Service: Orthopedics;  Laterality: Left;  . SECONDARY CLOSURE OF WOUND Left 05/29/2018   Procedure: CLOSURE OF LEFT FOREARM FASCIOTOMY;  Surgeon: LanMarchia BondD;  Location: [facility];  Service: Orthopedics;  Laterality: Left;    Social History   Socioeconomic History  . Marital status: Married  Spouse name: Not on file  . Number of children: Not on file  . Years of education: Not on file  . Highest education level: Not on file  Occupational History  . Occupation: Scientist, clinical (histocompatibility and immunogenetics): [employer]  Tobacco Use  . Smoking status: Never Smoker  . Smokeless tobacco: Never Used  Substance and Sexual Activity  . Alcohol use: Yes    Comment: "socially"  . Drug use: No  . Sexual activity:  Yes  Other Topics Concern  . Not on file  Social History Narrative  . Not on file   Social Determinants of Health   Financial Resource Strain:   . Difficulty of Paying Living Expenses: Not on file  Food Insecurity:   . Worried About Charity fundraiser in the Last Year: Not on file  . Ran Out of Food in the Last Year: Not on file  Transportation Needs:   . Lack of Transportation (Medical): Not on file  . Lack of Transportation (Non-Medical): Not on file  Physical Activity:   . Days of Exercise per Week: Not on file  . Minutes of Exercise per Session: Not on file  Stress:   . Feeling of Stress : Not on file  Social Connections:   . Frequency of Communication with Friends and Family: Not on file  . Frequency of Social Gatherings with Friends and Family: Not on file  . Attends Religious Services: Not on file  . Active Member of Clubs or Organizations: Not on file  . Attends Archivist Meetings: Not on file  . Marital Status: Not on file    Family History  Problem Relation Age of Onset  . Asthma Mother   . Diabetes Mother   . Hernia Mother   . Diabetes Father   . Allergies Son   . Asthma Son   . Diabetes Maternal Aunt   . Diabetes Paternal Aunt        Review of Systems  Constitutional: Negative for fever, malaise/fatigue and weight loss.  HENT: Negative for congestion and sore throat.   Eyes:       Negative for visual changes  Respiratory: Negative for cough and shortness of breath.   Cardiovascular: Negative for chest pain, palpitations and leg swelling.  Gastrointestinal: Negative for blood in stool, constipation, diarrhea and heartburn.  Genitourinary: Negative for dysuria, frequency and urgency.  Musculoskeletal: Negative for falls, joint pain and myalgias.  Skin: Negative for rash.  Neurological: Negative for dizziness, sensory change and headaches.  Endo/Heme/Allergies: Does not bruise/bleed easily.  Psychiatric/Behavioral: Negative for depression,  substance abuse and suicidal ideas. The patient is not nervous/anxious.     Objective:   Vitals:   11/28/19 0947  BP: 112/70  Pulse: 79  Temp: (!) 97.3 F (36.3 C)  SpO2: 98%    Body mass index is 30.68 kg/m.   Physical Examination:  Physical Exam Vitals reviewed.  Constitutional:      General: He is not in acute distress.    Appearance: He is well-developed. He is obese.  HENT:     Right Ear: Tympanic membrane, ear canal and external ear normal.     Left Ear: Tympanic membrane, ear canal and external ear normal.  Eyes:     Extraocular Movements: Extraocular movements intact.     Conjunctiva/sclera: Conjunctivae normal.     Pupils: Pupils are equal, round, and reactive to light.  Cardiovascular:     Rate and Rhythm: Normal rate and regular rhythm.  Pulses: Normal pulses.     Heart sounds: Normal heart sounds.  Pulmonary:     Effort: Pulmonary effort is normal. No respiratory distress.     Breath sounds: Normal breath sounds.  Chest:     Chest wall: No tenderness.  Abdominal:     General: Bowel sounds are normal.     Palpations: Abdomen is soft.  Genitourinary:    Comments: declined Musculoskeletal:        General: Normal range of motion.     Cervical back: Normal range of motion and neck supple.  Lymphadenopathy:     Cervical: No cervical adenopathy.  Skin:    General: Skin is warm and dry.     Comments: Normal diabetic foot exam  Neurological:     Mental Status: He is alert and oriented to person, place, and time.     Deep Tendon Reflexes: Reflexes are normal and symmetric.  Psychiatric:        Mood and Affect: Mood normal.        Behavior: Behavior normal.        Thought Content: Thought content normal.     ASSESSMENT and PLAN: This visit occurred during the SARS-CoV-2 public health emergency.  Safety protocols were in place, including screening questions prior to the visit, additional usage of staff PPE, and extensive cleaning of exam room while  observing appropriate contact time as indicated for disinfecting solutions.   Rainier was seen today for establish care.  Diagnoses and all orders for this visit:  Preventative health care -     PSA -     CBC -     Comprehensive metabolic panel -     TSH -     Ambulatory referral to Gastroenterology  Type 2 diabetes mellitus with hyperglycemia, without long-term current use of insulin (HCC) -     Hemoglobin A1c -     Microalbumin / creatinine urine ratio -     metFORMIN (GLUCOPHAGE) 500 MG tablet; Take 1 tablet (500 mg total) by mouth daily with breakfast.  Mixed hyperlipidemia -     Lipid panel -     atorvastatin (LIPITOR) 40 MG tablet; Take 1 tablet (40 mg total) by mouth daily at 6 PM.  Prostate cancer screening -     PSA  Colon cancer screening -     Ambulatory referral to Gastroenterology  Hyperkeratosis of skin -     Ambulatory referral to Dermatology  Need for diphtheria-tetanus-pertussis (Tdap) vaccine -     Tdap vaccine greater than or equal to 7yo IM  Need for influenza vaccination -     Flu Vaccine QUAD High Dose(Fluad)    No problem-specific Assessment & Plan notes found for this encounter.      Problem List Items Addressed This Visit      Endocrine   DM (diabetes mellitus) ([place])   Relevant Medications   metFORMIN (GLUCOPHAGE) 500 MG tablet   atorvastatin (LIPITOR) 40 MG tablet   Other Relevant Orders   Hemoglobin A1c (Completed)   Microalbumin / creatinine urine ratio (Completed)     Other   Hyperkeratosis of skin   Relevant Orders   Ambulatory referral to Dermatology   Mixed hyperlipidemia   Relevant Medications   atorvastatin (LIPITOR) 40 MG tablet   Other Relevant Orders   Lipid panel (Completed)    Other Visit Diagnoses    Preventative health care    -  Primary   Relevant Orders   PSA (Completed)  CBC (Completed)   Comprehensive metabolic panel (Completed)   TSH (Completed)   Ambulatory referral to Gastroenterology   Prostate  cancer screening       Relevant Orders   PSA (Completed)   Colon cancer screening       Relevant Orders   Ambulatory referral to Gastroenterology   Need for diphtheria-tetanus-pertussis (Tdap) vaccine       Relevant Orders   Tdap vaccine greater than or equal to 7yo IM (Completed)   Need for influenza vaccination       Relevant Orders   Flu Vaccine QUAD High Dose(Fluad) (Completed)       Follow up: Return in about 6 months (around 05/27/2020) for DM (video).  Wilfred Lacy, NP

## 2019-11-28 NOTE — Patient Instructions (Addendum)
cerave or eucerin cream  Go to lab for blood draw and urine collection. You will be contacted with results  You will be contacted to schedule appt with GI, and dermatology. Schedule annual eye exam with opthalmology.  Thank you for choosing [facility] for your health needs.  Preventive Care 51-51 Years Old, Male Preventive care refers to lifestyle choices and visits with your health care provider that can promote health and wellness. This includes:  A yearly physical exam. This is also called an annual well check.  Regular dental and eye exams.  Immunizations.  Screening for certain conditions.  Healthy lifestyle choices, such as eating a healthy diet, getting regular exercise, not using drugs or products that contain nicotine and tobacco, and limiting alcohol use. What can I expect for my preventive care visit? Physical exam Your health care provider will check:  Height and weight. These may be used to calculate body mass index (BMI), which is a measurement that tells if you are at a healthy weight.  Heart rate and blood pressure.  Your skin for abnormal spots. Counseling Your health care provider may ask you questions about:  Alcohol, tobacco, and drug use.  Emotional well-being.  Home and relationship well-being.  Sexual activity.  Eating habits.  Work and work Statistician. What immunizations do I need?  Influenza (flu) vaccine  This is recommended every year. Tetanus, diphtheria, and pertussis (Tdap) vaccine  You may need a Td booster every 10 years. Varicella (chickenpox) vaccine  You may need this vaccine if you have not already been vaccinated. Zoster (shingles) vaccine  You may need this after age 20. Measles, mumps, and rubella (MMR) vaccine  You may need at least one dose of MMR if you were born in 1957 or later. You may also need a second dose. Pneumococcal conjugate (PCV13) vaccine  You may need this if you have certain conditions and  were not previously vaccinated. Pneumococcal polysaccharide (PPSV23) vaccine  You may need one or two doses if you smoke cigarettes or if you have certain conditions. Meningococcal conjugate (MenACWY) vaccine  You may need this if you have certain conditions. Hepatitis A vaccine  You may need this if you have certain conditions or if you travel or work in places where you may be exposed to hepatitis A. Hepatitis B vaccine  You may need this if you have certain conditions or if you travel or work in places where you may be exposed to hepatitis B. Haemophilus influenzae type b (Hib) vaccine  You may need this if you have certain risk factors. Human papillomavirus (HPV) vaccine  If recommended by your health care provider, you may need three doses over 6 months. You may receive vaccines as individual doses or as more than one vaccine together in one shot (combination vaccines). Talk with your health care provider about the risks and benefits of combination vaccines. What tests do I need? Blood tests  Lipid and cholesterol levels. These may be checked every 5 years, or more frequently if you are over 51 years old.  Hepatitis C test.  Hepatitis B test. Screening  Lung cancer screening. You may have this screening every year starting at age 51 if you have a 30-pack-year history of smoking and currently smoke or have quit within the past 15 years.  Prostate cancer screening. Recommendations will vary depending on your family history and other risks.  Colorectal cancer screening. All adults should have this screening starting at age 20 and continuing until age 51.  Your health care provider may recommend screening at age 51 if you are at increased risk. You will have tests every 1-10 years, depending on your results and the type of screening test.  Diabetes screening. This is done by checking your blood sugar (glucose) after you have not eaten for a while (fasting). You may have this done  every 1-3 years.  Sexually transmitted disease (STD) testing. Follow these instructions at home: Eating and drinking  Eat a diet that includes fresh fruits and vegetables, whole grains, lean protein, and low-fat dairy products.  Take vitamin and mineral supplements as recommended by your health care provider.  Do not drink alcohol if your health care provider tells you not to drink.  If you drink alcohol: ? Limit how much you have to 0-2 drinks a day. ? Be aware of how much alcohol is in your drink. In the [place] one drink equals one 12 oz bottle of beer (355 mL), one 5 oz glass of wine (148 mL), or one 1 oz glass of hard liquor (44 mL). Lifestyle  Take daily care of your teeth and gums.  Stay active. Exercise for at least 30 minutes on 5 or more days each week.  Do not use any products that contain nicotine or tobacco, such as cigarettes, e-cigarettes, and chewing tobacco. If you need help quitting, ask your health care provider.  If you are sexually active, practice safe sex. Use a condom or other form of protection to prevent STIs (sexually transmitted infections).  Talk with your health care provider about taking a low-dose aspirin every day starting at age 18. What's next?  Go to your health care provider once a year for a well check visit.  Ask your health care provider how often you should have your eyes and teeth checked.  Stay up to date on all vaccines. This information is not intended to replace advice given to you by your health care provider. Make sure you discuss any questions you have with your health care provider. Document Revised: 10/13/2018 Document Reviewed: 10/13/2018 Elsevier Patient Education  2020 [employer].

## 2019-11-30 ENCOUNTER — Encounter: Payer: Self-pay | Admitting: Nurse Practitioner

## 2020-01-25 ENCOUNTER — Encounter: Payer: Self-pay | Admitting: Nurse Practitioner

## 2020-02-27 ENCOUNTER — Ambulatory Visit: Payer: 59 | Admitting: Nurse Practitioner

## 2020-02-27 DIAGNOSIS — Z0289 Encounter for other administrative examinations: Secondary | ICD-10-CM

## 2020-06-05 ENCOUNTER — Ambulatory Visit: Payer: 59 | Admitting: Nurse Practitioner

## 2020-08-08 ENCOUNTER — Encounter: Payer: Self-pay | Admitting: Nurse Practitioner

## 2020-08-08 ENCOUNTER — Other Ambulatory Visit: Payer: Self-pay

## 2020-08-08 ENCOUNTER — Ambulatory Visit (INDEPENDENT_AMBULATORY_CARE_PROVIDER_SITE_OTHER): Payer: 59 | Admitting: Nurse Practitioner

## 2020-08-08 ENCOUNTER — Other Ambulatory Visit (INDEPENDENT_AMBULATORY_CARE_PROVIDER_SITE_OTHER): Payer: 59

## 2020-08-08 VITALS — BP 110/64 | HR 68 | Temp 97.4°F | Ht 68.0 in | Wt 196.0 lb

## 2020-08-08 DIAGNOSIS — I951 Orthostatic hypotension: Secondary | ICD-10-CM

## 2020-08-08 DIAGNOSIS — E1165 Type 2 diabetes mellitus with hyperglycemia: Secondary | ICD-10-CM | POA: Diagnosis not present

## 2020-08-08 DIAGNOSIS — R55 Syncope and collapse: Secondary | ICD-10-CM

## 2020-08-08 LAB — BASIC METABOLIC PANEL
BUN: 15 mg/dL (ref 6–23)
CO2: 28 mEq/L (ref 19–32)
Calcium: 9.1 mg/dL (ref 8.4–10.5)
Chloride: 103 mEq/L (ref 96–112)
Creatinine, Ser: 0.96 mg/dL (ref 0.40–1.50)
GFR: 91.13 mL/min (ref >60.00)
Glucose, Bld: 119 mg/dL — ABNORMAL HIGH (ref 70–99)
Potassium: 3.9 mEq/L (ref 3.5–5.1)
Sodium: 137 mEq/L (ref 135–145)

## 2020-08-08 LAB — CBC
HCT: 44.4 % (ref 39.0–52.0)
Hemoglobin: 14.9 g/dL (ref 13.0–17.0)
MCHC: 33.7 g/dL (ref 30.0–36.0)
MCV: 89.2 fl (ref 78.0–100.0)
Platelets: 208 10*3/uL (ref 150.0–400.0)
RBC: 4.98 Mil/uL (ref 4.22–5.81)
RDW: 12.6 % (ref 11.5–15.5)
WBC: 7.2 10*3/uL (ref 4.0–10.5)

## 2020-08-08 LAB — HEMOGLOBIN A1C: Hgb A1c MFr Bld: 7.5 % — ABNORMAL HIGH (ref 4.6–6.5)

## 2020-08-08 LAB — TSH: TSH: 2.24 u[IU]/mL (ref 0.35–4.50)

## 2020-08-08 NOTE — Patient Instructions (Addendum)
Go to [address] for blood draw  You will be contacted to schedule an appt with cardiology.  No change in ECG compared to previous ECG in 2019.  Do not take metformin on an empty stomach.  Orthostatic Hypotension Blood pressure is a measurement of how strongly, or weakly, your blood is pressing against the walls of your arteries. Orthostatic hypotension is a sudden drop in blood pressure that happens when you quickly change positions, such as when you get up from sitting or lying down. Arteries are blood vessels that carry blood from your heart throughout your body. When blood pressure is too low, you may not get enough blood to your brain or to the rest of your organs. This can cause weakness, light-headedness, rapid heartbeat, and fainting. This can last for just a few seconds or for up to a few minutes. Orthostatic hypotension is usually not a serious problem. However, if it happens frequently or gets worse, it may be a sign of something more serious. What are the causes? This condition may be caused by:  Sudden changes in posture, such as standing up quickly after you have been sitting or lying down.  Blood loss.  Loss of body fluids (dehydration).  Heart problems.  Hormone (endocrine) problems.  Pregnancy.  Severe infection.  Lack of certain nutrients.  Severe allergic reactions (anaphylaxis).  Certain medicines, such as blood pressure medicine or medicines that make the body lose excess fluids (diuretics). Sometimes, this condition can be caused by not taking medicine as directed, such as taking too much of a certain medicine. What increases the risk? The following factors may make you more likely to develop this condition:  Age. Risk increases as you get older.  Conditions that affect the heart or the central nervous system.  Taking certain medicines, such as blood pressure medicine or diuretics.  Being pregnant. What are the signs or symptoms? Symptoms of this  condition may include:  Weakness.  Light-headedness.  Dizziness.  Blurred vision.  Fatigue.  Rapid heartbeat.  Fainting, in severe cases. How is this diagnosed? This condition is diagnosed based on:  Your medical history.  Your symptoms.  Your blood pressure measurement. Your health care provider will check your blood pressure when you are: ? Lying down. ? Sitting. ? Standing. A blood pressure reading is recorded as two numbers, such as "120 over 80" (or 120/80). The first ("top") number is called the systolic pressure. It is a measure of the pressure in your arteries as your heart beats. The second ("bottom") number is called the diastolic pressure. It is a measure of the pressure in your arteries when your heart relaxes between beats. Blood pressure is measured in a unit called mm Hg. Healthy blood pressure for most adults is 120/80. If your blood pressure is below 90/60, you may be diagnosed with hypotension. Other information or tests that may be used to diagnose orthostatic hypotension include:  Your other vital signs, such as your heart rate and temperature.  Blood tests.  Tilt table test. For this test, you will be safely secured to a table that moves you from a lying position to an upright position. Your heart rhythm and blood pressure will be monitored during the test. How is this treated? This condition may be treated by:  Changing your diet. This may involve eating more salt (sodium) or drinking more water.  Taking medicines to raise your blood pressure.  Changing the dosage of certain medicines you are taking that might  be lowering your blood pressure.  Wearing compression stockings. These stockings help to prevent blood clots and reduce swelling in your legs. In some cases, you may need to go to the hospital for:  Fluid replacement. This means you will receive fluids through an IV.  Blood replacement. This means you will receive donated blood through an IV  (transfusion).  Treating an infection or heart problems, if this applies.  Monitoring. You may need to be monitored while medicines that you are taking wear off. Follow these instructions at home: Eating and drinking   Drink enough fluid to keep your urine pale yellow.  Eat a healthy diet, and follow instructions from your health care provider about eating or drinking restrictions. A healthy diet includes: ? Fresh fruits and vegetables. ? Whole grains. ? Lean meats. ? Low-fat dairy products.  Eat extra salt only as directed. Do not add extra salt to your diet unless your health care provider told you to do that.  Eat frequent, small meals.  Avoid standing up suddenly after eating. Medicines  Take over-the-counter and prescription medicines only as told by your health care provider. ? Follow instructions from your health care provider about changing the dosage of your current medicines, if this applies. ? Do not stop or adjust any of your medicines on your own. General instructions   Wear compression stockings as told by your health care provider.  Get up slowly from lying down or sitting positions. This gives your blood pressure a chance to adjust.  Avoid hot showers and excessive heat as directed by your health care provider.  Return to your normal activities as told by your health care provider. Ask your health care provider what activities are safe for you.  Do not use any products that contain nicotine or tobacco, such as cigarettes, e-cigarettes, and chewing tobacco. If you need help quitting, ask your health care provider.  Keep all follow-up visits as told by your health care provider. This is important. Contact a health care provider if you:  Vomit.  Have diarrhea.  Have a fever for more than 2-3 days.  Feel more thirsty than usual.  Feel weak and tired. Get help right away if you:  Have chest pain.  Have a fast or irregular heartbeat.  Develop  numbness in any part of your body.  Cannot move your arms or your legs.  Have trouble speaking.  Become sweaty or feel light-headed.  Faint.  Feel short of breath.  Have trouble staying awake.  Feel confused. Summary  Orthostatic hypotension is a sudden drop in blood pressure that happens when you quickly change positions.  Orthostatic hypotension is usually not a serious problem.  It is diagnosed by having your blood pressure taken lying down, sitting, and then standing.  It may be treated by changing your diet or adjusting your medicines. This information is not intended to replace advice given to you by your health care provider. Make sure you discuss any questions you have with your health care provider. Document Revised: 04/14/2018 Document Reviewed: 04/14/2018 Elsevier Patient Education  2020 [employer].

## 2020-08-08 NOTE — Progress Notes (Signed)
Subjective:  Patient ID: Francisco Smith, male    DOB: Mar 02, 1971  Age: 49 y.o. MRN: [id number]  CC: Follow-up (Pt c/o blood sugar numbes, he checked twice this morning (147 when he woke 136 later this morning) Pt has not ate all day. Pt states a couple of months ago he thinks it was very low because he passed out. Pt states he has been checking his BPs and noticed that they have been dropping low and is concerned. Pt states the lowest it has been is 98/72. )  Loss of Consciousness This is a recurrent problem. The current episode started more than 1 month ago. The problem occurs intermittently. The problem has been resolved. He lost consciousness for a period of less than 1 minute. The symptoms are aggravated by unknown factors. Pertinent negatives include no aura, bladder incontinence, bowel incontinence, confusion, diaphoresis, dizziness, headaches, light-headedness, malaise/fatigue, nausea, palpitations, vertigo, vomiting or weakness. He has tried nothing for the symptoms. His past medical history is significant for DM. There is no history of arrhythmia, CAD, a clotting disorder, CVA, HTN, seizures, a sudden death in family, TIA or vertigo.  1st incident was 2years ago: unable to remember the details. Found by his wife.unknown length of unconciousness 2nd incident was 34month ago: witnessed by his wife, no head injury.  Wt Readings from Last 3 Encounters:  08/08/20 196 lb (88.9 kg)  11/28/19 201 lb 12.8 oz (91.5 kg)  05/26/18 185 lb (83.9 kg)   BP Readings from Last 3 Encounters:  08/08/20 110/64  11/28/19 112/70  05/30/18 137/82   Reviewed past Medical, Social and Family history today.  Outpatient Medications Prior to Visit  Medication Sig Dispense Refill  . atorvastatin (LIPITOR) 40 MG tablet Take 1 tablet (40 mg total) by mouth daily at 6 PM. 90 tablet 3  . GlucoCom Lancets MISC Check blood sugar TID & QHS 100 each 0  . glucose blood (CHOICE DM FORA G20 TEST STRIPS) test strip Use  as instructed 100 each 12  . glucose monitoring kit (FREESTYLE) monitoring kit 1 each by Does not apply route as needed for other. Check blood sugar TID & QHS 1 each 0  . metFORMIN (GLUCOPHAGE) 500 MG tablet Take 1 tablet (500 mg total) by mouth daily with breakfast. 90 tablet 3   No facility-administered medications prior to visit.   ROS See HPI  Objective:  BP 110/64 (BP Location: Left Arm, Patient Position: Sitting, Cuff Size: Normal)   Pulse 68   Temp (!) 97.4 F (36.3 C) (Temporal)   Ht _0  (1.727 m)   Wt 196 lb (88.9 kg)   SpO2 97%   BMI 29.80 kg/m   ECG: NSR, no change compared to 2019 ECG  Physical Exam Vitals reviewed.  Cardiovascular:     Rate and Rhythm: Normal rate and regular rhythm.     Pulses: Normal pulses.     Heart sounds: Normal heart sounds.  Pulmonary:     Effort: Pulmonary effort is normal.     Breath sounds: Normal breath sounds.  Musculoskeletal:     Right lower leg: No edema.     Left lower leg: No edema.  Neurological:     Mental Status: He is alert and oriented to person, place, and time.  Psychiatric:        Mood and Affect: Mood normal.        Behavior: Behavior normal.    Assessment & Plan:  This visit occurred during the SARS-CoV-2 public health  emergency.  Safety protocols were in place, including screening questions prior to the visit, additional usage of staff PPE, and extensive cleaning of exam room while observing appropriate contact time as indicated for disinfecting solutions.   Zyier was seen today for follow-up.  Diagnoses and all orders for this visit:  Syncope and collapse -     Basic metabolic panel; Future -     CBC; Future -     TSH; Future -     EKG 12-Lead -     Ambulatory referral to Cardiology  Orthostatic hypotension -     Basic metabolic panel; Future -     CBC; Future -     TSH; Future -     EKG 12-Lead -     Ambulatory referral to Cardiology  Type 2 diabetes mellitus with hyperglycemia, without  long-term current use of insulin (HCC) -     Hemoglobin A1c; Future -     Basic metabolic panel; Future    Problem List Items Addressed This Visit      Endocrine   DM (diabetes mellitus) ([place])   Relevant Orders   Hemoglobin U2V   Basic metabolic panel    Other Visit Diagnoses    Syncope and collapse    -  Primary   Relevant Orders   Basic metabolic panel   CBC   TSH   EKG 12-Lead (Completed)   Ambulatory referral to Cardiology   Orthostatic hypotension       Relevant Orders   Basic metabolic panel   CBC   TSH   EKG 12-Lead (Completed)   Ambulatory referral to Cardiology      Follow-up: Return if symptoms worsen or fail to improve.  Wilfred Lacy, NP

## 2020-08-22 ENCOUNTER — Other Ambulatory Visit: Payer: Self-pay

## 2020-08-22 ENCOUNTER — Encounter: Payer: Self-pay | Admitting: *Deleted

## 2020-08-22 ENCOUNTER — Encounter: Payer: Self-pay | Admitting: Internal Medicine

## 2020-08-22 ENCOUNTER — Ambulatory Visit (INDEPENDENT_AMBULATORY_CARE_PROVIDER_SITE_OTHER): Payer: 59 | Admitting: Internal Medicine

## 2020-08-22 VITALS — BP 130/80 | HR 52 | Ht 68.0 in | Wt 189.2 lb

## 2020-08-22 DIAGNOSIS — R0683 Snoring: Secondary | ICD-10-CM

## 2020-08-22 DIAGNOSIS — I499 Cardiac arrhythmia, unspecified: Secondary | ICD-10-CM

## 2020-08-22 DIAGNOSIS — R55 Syncope and collapse: Secondary | ICD-10-CM

## 2020-08-22 DIAGNOSIS — E782 Mixed hyperlipidemia: Secondary | ICD-10-CM | POA: Diagnosis not present

## 2020-08-22 NOTE — Patient Instructions (Signed)
Medication Instructions:  No Changes In Medications at this time.  *If you need a refill on your cardiac medications before your next appointment, please call your pharmacy*  Lab Work: None Ordered At This Time.  If you have labs (blood work) drawn today and your tests are completely normal, you will receive your results only by: Marland Kitchen MyChart Message (if you have MyChart) OR . A paper copy in the mail If you have any lab test that is abnormal or we need to change your treatment, we will call you to review the results.  Testing/Procedures: Your physician has requested that you have an echocardiogram. Echocardiography is a painless test that uses sound waves to create images of your heart. It provides your doctor with information about the size and shape of your heart and how well your heart's chambers and valves are working. You may receive an ultrasound enhancing agent through an IV if needed to better visualize your heart during the echo.This procedure takes approximately one hour. There are no restrictions for this procedure. This will take place at the [address].   Your physician has recommended that you have a sleep study. This test records several body functions during sleep, including: brain activity, eye movement, oxygen and carbon dioxide blood levels, heart rate and rhythm, breathing rate and rhythm, the flow of air through your mouth and nose, snoring, body muscle movements, and chest and belly movement. SOMEONE WILL CONTACT YOU TO SCHEDULE THIS   [employer] Cardiac Event Monitor Instructions Your physician has requested you wear your cardiac event monitor for 30 days, (1-30). [employer] may call or text to confirm a shipping address. The monitor will be sent to a land address via UPS. [employer] will not ship a monitor to a PO BOX. It typically takes 3-5 days to receive your monitor after it has been enrolled. [employer] will assist with USPS tracking if your package  is delayed. The telephone number for [employer] is [phone]. Once you have received your monitor, please review the enclosed instructions. Instruction tutorials can also be viewed under help and settings on the enclosed cell phone. Your monitor has already been registered assigning a specific monitor serial # to you.  Applying the monitor Remove cell phone from case and turn it on. The cell phone works as IT consultant and needs to be within [employer] of you at all times. The cell phone will need to be charged on a daily basis. We recommend you plug the cell phone into the enclosed charger at your bedside table every night.  Monitor batteries: You will receive two monitor batteries labelled #1 and #2. These are your recorders. Plug battery #2 onto the second connection on the enclosed charger. Keep one battery on the charger at all times. This will keep the monitor battery deactivated. It will also keep it fully charged for when you need to switch your monitor batteries. A small light will be blinking on the battery emblem when it is charging. The light on the battery emblem will remain on when the battery is fully charged.  Open package of a Monitor strip. Insert battery #1 into black hood on strip and gently squeeze monitor battery onto connection as indicated in instruction booklet. Set aside while preparing skin.  Choose location for your strip, vertical or horizontal, as indicated in the instruction booklet. Shave to remove all hair from location. There cannot be any lotions, oils, powders, or colognes on skin where monitor is to be  applied. Wipe skin clean with enclosed Saline wipe. Dry skin completely.  Peel paper labeled #1 off the back of the Monitor strip exposing the adhesive. Place the monitor on the chest in the vertical or horizontal position shown in the instruction booklet. One arrow on the monitor strip must be pointing upward. Carefully remove paper labeled #2,  attaching remainder of strip to your skin. Try not to create any folds or wrinkles in the strip as you apply it.  Firmly press and release the circle in the center of the monitor battery. You will hear a small beep. This is turning the monitor battery on. The heart emblem on the monitor battery will light up every 5 seconds if the monitor battery in turned on and connected to the patient securely. Do not push and hold the circle down as this turns the monitor battery off. The cell phone will locate the monitor battery. A screen will appear on the cell phone checking the connection of your monitor strip. This may read poor connection initially but change to good connection within the next minute. Once your monitor accepts the connection you will hear a series of 3 beeps followed by a climbing crescendo of beeps. A screen will appear on the cell phone showing the two monitor strip placement options. Touch the picture that demonstrates where you applied the monitor strip.  Your monitor strip and battery are waterproof. You are able to shower, bathe, or swim with the monitor on. They just ask you do not submerge deeper than 3 feet underwater. We recommend removing the monitor if you are swimming in a lake, river, or ocean.  Your monitor battery will need to be switched to a fully charged monitor battery approximately once a week. The cell phone will alert you of an action which needs to be made.  On the cell phone, tap for details to reveal connection status, monitor battery status, and cell phone battery status. The green dots indicates your monitor is in good status. A red dot indicates there is something that needs your attention.  To record a symptom, click the circle on the monitor battery. In 30-60 seconds a list of symptoms will appear on the cell phone. Select your symptom and tap save. Your monitor will record a sustained or significant arrhythmia regardless of you clicking the button.  Some patients do not feel the heart rhythm irregularities. [employer] will notify us of any serious or critical events.  Refer to instruction booklet for instructions on switching batteries, changing strips, the Do not disturb or Pause features, or any additional questions.  Call [employer] at [phone], to confirm your monitor is transmitting and record your baseline. They will answer any questions you may have regarding the monitor instructions at that time.  Returning the monitor to [employer] Place all equipment back into blue box. Peel off strip of paper to expose adhesive and close box securely. There is a prepaid UPS shipping label on this box. Drop in a UPS drop box, or at a UPS facility like [employer]. You may also contact [employer] to arrange UPS to pick up monitor package at your home.  Follow-Up: At [facility], you and your health needs are our priority.  As part of our continuing mission to provide you with exceptional heart care, we have created designated Provider Care Teams.  These Care Teams include your primary Cardiologist (physician) and Advanced Practice Providers (APPs -  Physician Assistants and Nurse Practitioners) who all work together to provide you  with the care you need, when you need it.  Your next appointment:   8 week(s)  The format for your next appointment:   In Person  Provider:   Weston Brass, MD

## 2020-08-22 NOTE — Progress Notes (Signed)
Cardiology Office Note:    Date:  08/22/2020   ID:  SHEA KAPUR, DOB 03/07/1971, MRN [id number]  PCP:  Flossie Buffy, NP  Cardiologist:  No primary care provider on file.  Electrophysiologist:  None   Referring MD: Flossie Buffy, NP   Chief Complaint/Reason for Referral: Syncope, orthostatic hypotension  History of Present Illness:    Francisco Smith is a 49 y.o. male with a history of DM2, HLD who presents for evaluation of syncope and report of irregular heart rhythm.   He notes that 1-2 mo ago he fainted while getting out of the shower. He felt slightly dizzy, and notes tiredness afterward. He has experienced syncope before - years ago he fainted in the setting of extreme life stress, feels this was year 2014. He does not describe palpitations but when he is going to sleep at night his wife will hear an irregular heart beat. He does have fatigue and describes snoring. He denies chest pain, chest pressure, dyspnea at rest or with exertion, palpitations, PND, orthopnea, or leg swelling. Denies cough, fever, chills. Denies nausea, vomiting.  Fhx includes mother with MI x 2 in her 39s and frequent fainting spells.   He denies use of tobacco or recreational substance use, notes rare ETOH.   Past Medical History:  Diagnosis Date  . Atypical chest pain   . Type II or unspecified type diabetes mellitus without mention of complication, not stated as uncontrolled     Past Surgical History:  Procedure Laterality Date  . APPLICATION OF WOUND VAC Left 05/27/2018   Procedure: APPLICATION OF WOUND VAC;  Surgeon: Marchia Bond, MD;  Location: [facility];  Service: Orthopedics;  Laterality: Left;  . FASCIOTOMY Left 05/27/2018   Procedure: FASCIOTOMY LEFT FOREARM;  Surgeon: Marchia Bond, MD;  Location: [facility];  Service: Orthopedics;  Laterality: Left;  . SECONDARY CLOSURE OF WOUND Left 05/29/2018   Procedure: CLOSURE OF LEFT FOREARM FASCIOTOMY;  Surgeon: Marchia Bond, MD;   Location: [facility];  Service: Orthopedics;  Laterality: Left;    Current Medications: Current Meds  Medication Sig  . atorvastatin (LIPITOR) 40 MG tablet Take 1 tablet (40 mg total) by mouth daily at 6 PM.  . GlucoCom Lancets MISC Check blood sugar TID & QHS  . glucose blood (CHOICE DM FORA G20 TEST STRIPS) test strip Use as instructed  . glucose monitoring kit (FREESTYLE) monitoring kit 1 each by Does not apply route as needed for other. Check blood sugar TID & QHS  . metFORMIN (GLUCOPHAGE) 500 MG tablet Take 1 tablet (500 mg total) by mouth daily with breakfast.     Allergies:   Patient has no known allergies.   Social History   Tobacco Use  . Smoking status: Never Smoker  . Smokeless tobacco: Never Used  Vaping Use  . Vaping Use: Never used  Substance Use Topics  . Alcohol use: Yes    Comment: "socially"  . Drug use: No     Family History: The patient's family history includes Allergies in his son; Asthma in his mother and son; Diabetes in his father, maternal aunt, mother, and paternal aunt; Hernia in his mother.  ROS:   Please see the history of present illness.    All other systems reviewed and are negative.  EKGs/Labs/Other Studies Reviewed:    The following studies were reviewed today:  EKG:  Sinus brady rate 52, possible early repolarization.   Recent Labs: 11/28/2019: ALT 28 08/08/2020: BUN 15; Creatinine, Ser 0.96;  Hemoglobin 14.9; Platelets 208.0; Potassium 3.9; Sodium 137; TSH 2.24  Recent Lipid Panel    Component Value Date/Time   CHOL 184 11/28/2019 1037   TRIG 163.0 (H) 11/28/2019 1037   HDL 32.00 (L) 11/28/2019 1037   CHOLHDL 6 11/28/2019 1037   VLDL 32.6 11/28/2019 1037   LDLCALC 119 (H) 11/28/2019 1037    Physical Exam:    VS:  BP 130/80   Pulse (!) 52   Ht _0  (1.727 m)   Wt 189 lb 3.2 oz (85.8 kg)   BMI 28.77 kg/m     Wt Readings from Last 5 Encounters:  08/22/20 189 lb 3.2 oz (85.8 kg)  08/08/20 196 lb (88.9 kg)  11/28/19 201 lb  12.8 oz (91.5 kg)  05/26/18 185 lb (83.9 kg)  12/13/15 201 lb (91.2 kg)    Constitutional: No acute distress Eyes: sclera non-icteric, normal conjunctiva and lids ENMT: normal dentition, moist mucous membranes Cardiovascular: regular rhythm, normal rate, no murmurs. S1 and S2 normal. Radial pulses normal bilaterally. No jugular venous distention.  Respiratory: clear to auscultation bilaterally GI : normal bowel sounds, soft and nontender. No distention.   MSK: extremities warm, well perfused. No edema.  NEURO: grossly nonfocal exam, moves all extremities. PSYCH: alert and oriented x 3, normal mood and affect.   ASSESSMENT:    1. Irregular heart rhythm   2. Syncope and collapse   3. Snoring   4. Mixed hyperlipidemia    PLAN:    Irregular rhythm and syncope - he will need a 30 day event monitor to accurately capture possible rhythm disturbance. He is bradycardic, concern for worsening of bradycardia or AVB contributing to symptoms and wife's report of hearing irregular beats. Recommend echocardiogram as well to evaluate for structural contributors to syncope.   Snoring - recommend sleep study.   HLD - continue atorvastatin 40 mg daily. Lipids not optimized - LDL 119, Trig 163, HDL 32. Consider increasing dose of statin to 80 mg daily.   DM2 - A1C 7.5%, on metformin. Management per PCP.  Total time of encounter: 60 minutes total time of encounter, including 35 minutes spent in face-to-face patient care on the date of this encounter. This time includes coordination of care and counseling regarding above mentioned problem list. Remainder of non-face-to-face time involved reviewing chart documents/testing relevant to the patient encounter and documentation in the medical record. I have independently reviewed documentation from referring provider.   Cherlynn Kaiser, MD [place]  [facility]    Medication Adjustments/Labs and Tests Ordered: Current medicines are reviewed at  length with the patient today.  Concerns regarding medicines are outlined above.   No orders of the defined types were placed in this encounter.   No orders of the defined types were placed in this encounter.   There are no Patient Instructions on file for this visit.

## 2020-08-22 NOTE — Progress Notes (Signed)
Patient ID: Francisco Smith, male   DOB: Jul 16, 1971, 49 y.o.   MRN: [id number] Patient enrolled for [employer] to ship a 30 day cardiac event monitor to his home.

## 2020-09-11 ENCOUNTER — Telehealth: Payer: Self-pay | Admitting: Internal Medicine

## 2020-09-11 NOTE — Telephone Encounter (Signed)
No PA required, call reference # [phone] [phone]

## 2020-09-18 ENCOUNTER — Telehealth (HOSPITAL_COMMUNITY): Payer: Self-pay | Admitting: Internal Medicine

## 2020-09-18 NOTE — Telephone Encounter (Signed)
Patient called and cancelled echocardiogram for 09/19/20 due to he had to work and does not wish to reschedule at this time. Order will be removed from the ECHO WQ and if pt calls back to schedule we will reinstate the order.

## 2020-09-19 ENCOUNTER — Other Ambulatory Visit (HOSPITAL_COMMUNITY): Payer: 59

## 2020-10-19 ENCOUNTER — Encounter (HOSPITAL_BASED_OUTPATIENT_CLINIC_OR_DEPARTMENT_OTHER): Payer: 59 | Admitting: Cardiovascular Disease

## 2020-11-21 ENCOUNTER — Ambulatory Visit: Payer: 59 | Admitting: Internal Medicine

## 2021-02-05 ENCOUNTER — Other Ambulatory Visit: Payer: Self-pay | Admitting: Nurse Practitioner

## 2021-02-05 DIAGNOSIS — E1165 Type 2 diabetes mellitus with hyperglycemia: Secondary | ICD-10-CM

## 2021-03-31 ENCOUNTER — Other Ambulatory Visit: Payer: Self-pay | Admitting: Nurse Practitioner

## 2021-03-31 DIAGNOSIS — E1165 Type 2 diabetes mellitus with hyperglycemia: Secondary | ICD-10-CM

## 2021-06-05 ENCOUNTER — Ambulatory Visit (INDEPENDENT_AMBULATORY_CARE_PROVIDER_SITE_OTHER): Payer: 59 | Admitting: Nurse Practitioner

## 2021-06-05 ENCOUNTER — Encounter: Payer: Self-pay | Admitting: Nurse Practitioner

## 2021-06-05 ENCOUNTER — Other Ambulatory Visit: Payer: Self-pay

## 2021-06-05 ENCOUNTER — Telehealth: Payer: Self-pay | Admitting: Nurse Practitioner

## 2021-06-05 VITALS — BP 110/64 | HR 62 | Temp 97.7°F | Wt 182.0 lb

## 2021-06-05 DIAGNOSIS — E119 Type 2 diabetes mellitus without complications: Secondary | ICD-10-CM

## 2021-06-05 DIAGNOSIS — E1165 Type 2 diabetes mellitus with hyperglycemia: Secondary | ICD-10-CM

## 2021-06-05 LAB — BASIC METABOLIC PANEL
BUN: 16 mg/dL (ref 6–23)
CO2: 23 mEq/L (ref 19–32)
Calcium: 8.6 mg/dL (ref 8.4–10.5)
Chloride: 106 mEq/L (ref 96–112)
Creatinine, Ser: 0.86 mg/dL (ref 0.40–1.50)
GFR: 100.02 mL/min (ref >60.00)
Glucose, Bld: 115 mg/dL — ABNORMAL HIGH (ref 70–99)
Potassium: 3.9 mEq/L (ref 3.5–5.1)
Sodium: 139 mEq/L (ref 135–145)

## 2021-06-05 LAB — MICROALBUMIN / CREATININE URINE RATIO
Creatinine,U: 33.7 mg/dL
Microalb Creat Ratio: 2.1 mg/g (ref 0.0–30.0)
Microalb, Ur: <0.7 mg/dL (ref 0.0–1.9)

## 2021-06-05 LAB — HEMOGLOBIN A1C: Hgb A1c MFr Bld: 6.2 % (ref 4.6–6.5)

## 2021-06-05 LAB — TSH: TSH: 3.31 u[IU]/mL (ref 0.35–5.50)

## 2021-06-05 NOTE — Progress Notes (Signed)
Subjective:  Patient ID: Francisco Smith, male    DOB: September 07, 1971  Age: 50 y.o. MRN: [id number]  CC: Follow-up (Medication refills needed (Metformin). )  HPI  Denies any syncopal episode since appt with cardiology. Did not complete sleep study nor holter monitor due to work schedule.  DM (diabetes mellitus) ([place]) Controlled with metformin No glucose check at home. 14lbs weight loss noted in last 59month. He reports it is intention through heart health diet and exercise. Wt Readings from Last 3 Encounters:  06/05/21 182 lb (82.6 kg)  08/22/20 189 lb 3.2 oz (85.8 kg)  08/08/20 196 lb (88.9 kg)  normal foot exam Advised to schedule appt with ophthalmology.  Check HgbA1c, BMP, TSH today  Reviewed past Medical, Social and Family history today.  Outpatient Medications Prior to Visit  Medication Sig Dispense Refill   atorvastatin (LIPITOR) 40 MG tablet Take 1 tablet (40 mg total) by mouth daily at 6 PM. 90 tablet 3   GlucoCom Lancets MISC Check blood sugar TID & QHS 100 each 0   glucose blood (CHOICE DM FORA G20 TEST STRIPS) test strip Use as instructed 100 each 12   glucose monitoring kit (FREESTYLE) monitoring kit 1 each by Does not apply route as needed for other. Check blood sugar TID & QHS 1 each 0   metFORMIN (GLUCOPHAGE) 500 MG tablet Take 1 tablet (500 mg total) by mouth daily with breakfast. No additional refills without office visit 90 tablet 0   No facility-administered medications prior to visit.    ROS See HPI  Objective:  BP 110/64 (BP Location: Left Arm, Patient Position: Sitting, Cuff Size: Large)   Pulse 62   Temp 97.7 F (36.5 C) (Temporal)   Wt 182 lb (82.6 kg)   SpO2 98%   BMI 27.67 kg/m   Physical Exam Neck:     Thyroid: No thyroid mass, thyromegaly or thyroid tenderness.  Cardiovascular:     Rate and Rhythm: Normal rate and regular rhythm.     Pulses: Normal pulses.     Heart sounds: Normal heart sounds.  Pulmonary:     Effort: Pulmonary effort  is normal.     Breath sounds: Normal breath sounds.  Abdominal:     General: Bowel sounds are normal.     Palpations: Abdomen is soft.  Musculoskeletal:     Cervical back: Normal range of motion.     Right lower leg: No edema.     Left lower leg: No edema.  Lymphadenopathy:     Cervical: No cervical adenopathy.  Neurological:     Mental Status: He is alert and oriented to person, place, and time.  Psychiatric:        Mood and Affect: Mood normal.        Behavior: Behavior normal.        Thought Content: Thought content normal.   Assessment & Plan:  This visit occurred during the SARS-CoV-2 public health emergency.  Safety protocols were in place, including screening questions prior to the visit, additional usage of staff PPE, and extensive cleaning of exam room while observing appropriate contact time as indicated for disinfecting solutions.   Francisco Smith seen today for follow-up.  Diagnoses and all orders for this visit:  Type 2 diabetes mellitus with hyperglycemia, without long-term current use of insulin (HCC) -     Hemoglobin A1c -     Basic metabolic panel -     Microalbumin / creatinine urine ratio -  TSH  Schedule appt with cardiology, gastroenterology, dentist and ophthalmology.  Problem List Items Addressed This Visit       Endocrine   DM (diabetes mellitus) ([place]) - Primary    Controlled with metformin No glucose check at home. 14lbs weight loss noted in last 75month. He reports it is intention through heart health diet and exercise. Wt Readings from Last 3 Encounters:  06/05/21 182 lb (82.6 kg)  08/22/20 189 lb 3.2 oz (85.8 kg)  08/08/20 196 lb (88.9 kg)  normal foot exam Advised to schedule appt with ophthalmology.  Check HgbA1c, BMP, TSH today      Relevant Orders   Hemoglobin AP0G  Basic metabolic panel   Microalbumin / creatinine urine ratio   TSH    Follow-up: Return in about 3 months (around 09/05/2021) for CPE (fasting).  [name]  NP

## 2021-06-05 NOTE — Patient Instructions (Addendum)
Go to lab for blood draw and urine collection  Schedule appt with cardiology, gastroenterology, dentist and ophthalmology.

## 2021-06-05 NOTE — Telephone Encounter (Signed)
1.Medication Requested: metFORMIN (GLUCOPHAGE) 500 MG tablet  2. Pharmacy (Name, Street, [place]): [facility] [address], [place] - [address]  Phone:  [phone] Fax:  [phone]   3. On Med List: yes  4. Last Visit with PCP: 08.04.22  5. Next visit date with PCP: n/a   Agent: Please be advised that RX refills may take up to 3 business days. We ask that you follow-up with your pharmacy.

## 2021-06-05 NOTE — Assessment & Plan Note (Addendum)
Controlled with metformin No glucose check at home. 14lbs weight loss noted in last 18months. He reports it is intention through heart health diet and exercise. Wt Readings from Last 3 Encounters:  06/05/21 182 lb (82.6 kg)  08/22/20 189 lb 3.2 oz (85.8 kg)  08/08/20 196 lb (88.9 kg)  normal foot exam Advised to schedule appt with ophthalmology.  Check HgbA1c, BMP, TSH today

## 2021-06-08 ENCOUNTER — Other Ambulatory Visit: Payer: Self-pay | Admitting: Nurse Practitioner

## 2021-06-08 DIAGNOSIS — E1165 Type 2 diabetes mellitus with hyperglycemia: Secondary | ICD-10-CM

## 2021-06-10 NOTE — Telephone Encounter (Signed)
Chart supports rx refill Last ov: 06/05/2021 Last refill: 02/05/2021

## 2021-08-21 ENCOUNTER — Other Ambulatory Visit: Payer: Self-pay | Admitting: Nurse Practitioner

## 2021-08-21 DIAGNOSIS — E1165 Type 2 diabetes mellitus with hyperglycemia: Secondary | ICD-10-CM

## 2021-08-22 ENCOUNTER — Other Ambulatory Visit: Payer: Self-pay

## 2021-08-22 DIAGNOSIS — E1165 Type 2 diabetes mellitus with hyperglycemia: Secondary | ICD-10-CM

## 2021-08-22 MED ORDER — METFORMIN HCL 500 MG PO TABS: ORAL_TABLET | ORAL | 0 refills | Status: DC

## 2021-08-22 NOTE — Progress Notes (Signed)
Chart supports rx refill Last ov: 06/05/2021 Last refill: 06/10/2021

## 2021-09-09 ENCOUNTER — Ambulatory Visit
Admission: EM | Admit: 2021-09-09 | Discharge: 2021-09-09 | Disposition: A | Discharge status: Home / Self Care | Payer: 59 | Attending: Emergency Medicine | Admitting: Emergency Medicine

## 2021-09-09 ENCOUNTER — Encounter: Payer: Self-pay | Admitting: Emergency Medicine

## 2021-09-09 DIAGNOSIS — R519 Headache, unspecified: Secondary | ICD-10-CM | POA: Diagnosis not present

## 2021-09-09 MED ORDER — METOCLOPRAMIDE HCL 5 MG/ML IJ SOLN
10.0000 mg | Freq: Once | INTRAMUSCULAR | Status: AC
  Administered 2021-09-09: 10 mg via INTRAMUSCULAR

## 2021-09-09 MED ORDER — SUMATRIPTAN SUCCINATE 50 MG PO TABS: ORAL_TABLET | ORAL | 0 refills | Status: AC

## 2021-09-09 MED ORDER — METOCLOPRAMIDE HCL 5 MG/ML IJ SOLN: 10.0000 mg | Freq: Once | INTRAMUSCULAR | Status: DC

## 2021-09-09 MED ORDER — KETOROLAC TROMETHAMINE 60 MG/2ML IM SOLN
60.0000 mg | Freq: Once | INTRAMUSCULAR | Status: AC
  Administered 2021-09-09: 60 mg via INTRAMUSCULAR

## 2021-09-09 MED ORDER — BACLOFEN 10 MG PO TABS: 10.0000 mg | ORAL_TABLET | Freq: Every day | ORAL | 0 refills | Status: AC
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
## 2021-09-09 NOTE — Discharge Instructions (Addendum)
For your headaches today, you received an injection of ketorolac and Reglan.  I do not anticipate that you will have a headache tonight at all.  I would also like for you to take a muscle relaxer tonight called baclofen, it should relax the muscles around your neck and shoulders and help you get a very good night sleep.  Please pick up your prescriptions for baclofen and Imitrex at your pharmacy today.  Baclofen is a muscle relaxer.  Imitrex is a headache reliever.  Imitrex works best if you take it right when the headache starts and take it with 400 mg of ibuprofen and a spoonful of peanut butter.  Imitrex does not work well if you have had a headache for several hours and it has become unbearable so it is important that you recognize when you are getting a headache and take Imitrex as soon as possible.  In the event that Imitrex and ibuprofen with a spoonful of peanut butter do not relieve your headache completely in 1 hour, you are welcome to take a second Imitrex tablet.  As we discussed, please make a follow-up appointment with your primary care provider to discuss your headaches, the medication regimen I have provided for you, and any further recommendations that your primary care provider has for you.  Thank you for visiting urgent care today.  I am aware that your time is valuable and I very much appreciate your waiting.

## 2021-09-09 NOTE — ED Triage Notes (Signed)
Pt c/o migraine headache that started Sunday and is not improving. He has taken tylenol with no relief and states he is having light sensitivity now.

## 2021-10-01 NOTE — Telephone Encounter (Signed)
Duplicate

## 2021-11-27 ENCOUNTER — Other Ambulatory Visit: Payer: Self-pay | Admitting: Nurse Practitioner

## 2021-11-27 ENCOUNTER — Telehealth: Payer: Self-pay | Admitting: Nurse Practitioner

## 2021-11-27 DIAGNOSIS — E1165 Type 2 diabetes mellitus with hyperglycemia: Secondary | ICD-10-CM

## 2021-11-27 MED ORDER — METFORMIN HCL 500 MG PO TABS: ORAL_TABLET | ORAL | 0 refills | Status: DC

## 2021-11-27 NOTE — Telephone Encounter (Signed)
Sched pt for Francisco Smith's first avail (March 7th)... [name] agreed to call in Rx to get him by until then.

## 2021-11-27 NOTE — Addendum Note (Signed)
Addended by: Mickle Plumb L on: 11/27/2021 02:00 PM   Modules accepted: Orders

## 2021-11-27 NOTE — Telephone Encounter (Signed)
Lft VM to rtn call to schedule an appointment due to being over due for f/u.  Dm/cma

## 2021-11-27 NOTE — Telephone Encounter (Signed)
RX sent to the pharmacy till his scheduled appointment. Dm/cma

## 2022-01-06 ENCOUNTER — Ambulatory Visit: Payer: 59 | Admitting: Nurse Practitioner

## 2022-01-06 ENCOUNTER — Telehealth: Payer: Self-pay | Admitting: Nurse Practitioner

## 2022-01-06 NOTE — Telephone Encounter (Signed)
Patient/Caregiver was notified of No Show/Late Cancellation Policy & possible $50 charge. ?Visit was cancelled with reason "No Show/Cancel within 24 hours" for tracking & charging. ? ?Caller Name: Francisco Smith  ?Caller Ph #: [phone] ?Date of APPT: 01/06/22 ?Reason given for no show/late cancellation: no reason no show ?No Show Letter printed & put in outgoing mail (Yes/No): yes ? ?~~~Route message to admin supervisor and clinical team/CMA~~~ ? ?  ?

## 2022-01-13 NOTE — Telephone Encounter (Signed)
First no show in 12 month period - fee waived ?

## 2022-03-10 ENCOUNTER — Other Ambulatory Visit: Payer: Self-pay | Admitting: Nurse Practitioner

## 2022-03-10 DIAGNOSIS — E1165 Type 2 diabetes mellitus with hyperglycemia: Secondary | ICD-10-CM

## 2022-03-11 NOTE — Telephone Encounter (Signed)
Chart supports Rx ?Last seen 06/2021 ?LVM informing patient to call office to schedule appointment in order to get additional refills.  ? ?

## 2022-03-17 ENCOUNTER — Telehealth: Payer: Self-pay | Admitting: Nurse Practitioner

## 2022-03-17 ENCOUNTER — Ambulatory Visit: Payer: 59 | Admitting: Nurse Practitioner

## 2022-03-17 NOTE — Telephone Encounter (Signed)
**   3rd No Show with Alysia Penna** ? ?Patient/Caregiver was notified of No Show/Late Cancellation Policy & possible $50 charge. ?Visit was cancelled with reason "No Show/Cancel within 24 hours" for tracking & charging. ? ?Date of APPT: 03/17/22 ?Reason given for no show/late cancellation: no reason no call ?No Show Letter printed & put in outgoing mail (Yes/No): yes ? ?~~~Route message to admin supervisor and clinical team/CMA~~~ ? ?  ?

## 2022-04-08 ENCOUNTER — Encounter: Payer: Self-pay | Admitting: Nurse Practitioner

## 2022-04-08 NOTE — Telephone Encounter (Signed)
No show 01/06/2022 and 03/17/2022 (2 w/in 1 year).  Sent final warning letter. Generated fee.

## 2022-06-08 ENCOUNTER — Other Ambulatory Visit: Payer: Self-pay | Admitting: Nurse Practitioner

## 2022-06-08 DIAGNOSIS — E1165 Type 2 diabetes mellitus with hyperglycemia: Secondary | ICD-10-CM

## 2022-06-09 NOTE — Telephone Encounter (Signed)
Chart supports Rx Last OV: 06/2021 Next OV: Has no showed 2 appts  30 day supply given, pt needs appt for further refills.

## 2022-07-01 ENCOUNTER — Other Ambulatory Visit: Payer: Self-pay | Admitting: Nurse Practitioner

## 2022-07-01 DIAGNOSIS — E1165 Type 2 diabetes mellitus with hyperglycemia: Secondary | ICD-10-CM

## 2022-07-01 NOTE — Telephone Encounter (Signed)
Per last few notes, pt needed a f/u appt to receive further refills.

## 2022-07-09 ENCOUNTER — Other Ambulatory Visit: Payer: Self-pay | Admitting: Nurse Practitioner

## 2022-07-09 DIAGNOSIS — E1165 Type 2 diabetes mellitus with hyperglycemia: Secondary | ICD-10-CM

## 2024-02-02 ENCOUNTER — Telehealth: Payer: Self-pay | Admitting: Nurse Practitioner

## 2024-02-02 NOTE — Telephone Encounter (Signed)
 Lvmtcb to schedule appt if needed.
